# Patient Record
Sex: Female | Born: 1990 | Race: White | Hispanic: No | Marital: Single | Smoking: Current every day smoker
Health system: Southern US, Community
[De-identification: ages and names within clinical notes are randomized; demographics above are authoritative.]

## PROBLEM LIST (undated history)

## (undated) DIAGNOSIS — S069X9A Unspecified intracranial injury with loss of consciousness of unspecified duration, initial encounter: Secondary | ICD-10-CM

## (undated) DIAGNOSIS — F419 Anxiety disorder, unspecified: Secondary | ICD-10-CM

## (undated) DIAGNOSIS — A599 Trichomoniasis, unspecified: Secondary | ICD-10-CM

## (undated) HISTORY — DX: Unspecified intracranial injury with loss of consciousness of unspecified duration, initial encounter: S06.9X9A

## (undated) HISTORY — DX: Anxiety disorder, unspecified: F41.9

## (undated) HISTORY — DX: Trichomoniasis, unspecified: A59.9

---
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
## 2011-01-14 ENCOUNTER — Ambulatory Visit (INDEPENDENT_AMBULATORY_CARE_PROVIDER_SITE_OTHER): Payer: BC Managed Care – PPO | Admitting: Medical

## 2011-01-14 ENCOUNTER — Encounter: Payer: Self-pay | Admitting: Medical

## 2011-01-14 VITALS — BP 140/88 | HR 100 | Temp 98.5°F | Ht 63.0 in | Wt 152.0 lb

## 2011-01-14 DIAGNOSIS — M79609 Pain in unspecified limb: Secondary | ICD-10-CM

## 2011-01-14 DIAGNOSIS — M79673 Pain in unspecified foot: Secondary | ICD-10-CM

## 2011-01-14 NOTE — Progress Notes (Signed)
Subjective:   HPI  Taylor Green is a 20 y.o. female who presents for left heel pain. She woke up with the heel hurting today. She denies any injury or fall or trauma. Denies similar prior episode. She does walk barefooted at times but denies any recent concern for splinter. She has been wearing flip-flops in [place], hasn't been wearing cushioning or tennis shoes recently. She denies any redness or swelling, mainly just pain on the outside of her left heel.No other aggravating or relieving factors.  No other c/o.  The following portions of the patient's history were reviewed and updated as appropriate: allergies, current medications, past family history, past medical history, past social history, past surgical history and problem list.  Review of Systems Constitutional: denies fever, chills, sweats, unexpected weight change, anorexia, fatigue Dermatology: denies rash Neurology: no tingling, numbness    Objective:   Physical Exam  General appearance: alert, no distress, WD/WN,  Musculoskeletal: Left lateral heel with mild generalized tenderness, otherwise no obvious puncture wound, no erythema, no induration, no fluctuance, no swelling, mild pain with walking localized to the left lateral heel, otherwise ankle and foot exam unremarkable bilaterally, no obvious foreign body or puncture wound Extremities: no edema, no cyanosis, no clubbing Pulses: 2+ symmetric lower extremities, normal cap refill Neurological: Normal foot sensation and strength  Assessment :    Encounter Diagnosis  Name Primary?  . Heel pain Yes     Plan:    No obvious splinter or foreign body. I suspect a contusion of the heel. Advised ice, elevation, relative rest, over-the-counter ibuprofen 200 mg 2-3 tablets every 6 hours as needed, wear more supportive shoes such as tennis shoes instead of the flaps she has been wearing. If not improving or worse, call or return and we can consider x-ray.

## 2011-02-03 ENCOUNTER — Telehealth: Payer: Self-pay | Admitting: *Deleted

## 2011-02-03 NOTE — Telephone Encounter (Signed)
Done

## 2011-02-04 ENCOUNTER — Ambulatory Visit: Payer: BC Managed Care – PPO | Admitting: Medical

## 2011-03-24 ENCOUNTER — Encounter: Payer: BC Managed Care – PPO | Attending: Neurosurgery | Admitting: Neurosurgery

## 2011-03-24 DIAGNOSIS — R51 Headache: Secondary | ICD-10-CM | POA: Insufficient documentation

## 2011-03-24 DIAGNOSIS — X58XXXA Exposure to other specified factors, initial encounter: Secondary | ICD-10-CM | POA: Insufficient documentation

## 2011-03-24 DIAGNOSIS — Z79899 Other long term (current) drug therapy: Secondary | ICD-10-CM | POA: Insufficient documentation

## 2011-03-24 DIAGNOSIS — S069XAA Unspecified intracranial injury with loss of consciousness status unknown, initial encounter: Secondary | ICD-10-CM

## 2011-03-24 DIAGNOSIS — S069X9A Unspecified intracranial injury with loss of consciousness of unspecified duration, initial encounter: Secondary | ICD-10-CM
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
## 2011-03-27 ENCOUNTER — Other Ambulatory Visit: Payer: Self-pay | Admitting: Physical Medicine & Rehabilitation

## 2011-03-27 DIAGNOSIS — R51 Headache: Secondary | ICD-10-CM

## 2011-03-30 ENCOUNTER — Ambulatory Visit (HOSPITAL_COMMUNITY)
Admission: RE | Admit: 2011-03-30 | Discharge: 2011-03-30 | Disposition: A | Discharge status: Home / Self Care | Payer: BC Managed Care – PPO | Source: Ambulatory Visit | Attending: Physical Medicine & Rehabilitation | Admitting: Physical Medicine & Rehabilitation

## 2011-03-30 ENCOUNTER — Encounter (HOSPITAL_COMMUNITY): Payer: Self-pay

## 2011-03-30 DIAGNOSIS — G319 Degenerative disease of nervous system, unspecified: Secondary | ICD-10-CM | POA: Insufficient documentation

## 2011-03-30 DIAGNOSIS — Z8673 Personal history of transient ischemic attack (TIA), and cerebral infarction without residual deficits: Secondary | ICD-10-CM | POA: Insufficient documentation

## 2011-03-30 DIAGNOSIS — R51 Headache: Secondary | ICD-10-CM

## 2011-03-30 MED ORDER — IOHEXOL 300 MG/ML  SOLN
80.0000 mL | Freq: Once | INTRAMUSCULAR | Status: AC | PRN
  Administered 2011-03-30: 80 mL via INTRAVENOUS

## 2011-05-06 ENCOUNTER — Encounter: Payer: Self-pay | Admitting: Family Medicine

## 2011-05-20 ENCOUNTER — Ambulatory Visit: Payer: BC Managed Care – PPO | Admitting: Physical Medicine & Rehabilitation

## 2011-10-13 ENCOUNTER — Encounter: Payer: Self-pay | Admitting: Family Medicine

## 2011-10-13 ENCOUNTER — Ambulatory Visit (INDEPENDENT_AMBULATORY_CARE_PROVIDER_SITE_OTHER): Payer: BC Managed Care – PPO | Admitting: Family Medicine

## 2011-10-13 VITALS — BP 120/74 | HR 68 | Wt 159.0 lb

## 2011-10-13 DIAGNOSIS — R51 Headache: Secondary | ICD-10-CM

## 2011-10-13 DIAGNOSIS — S069X9A Unspecified intracranial injury with loss of consciousness of unspecified duration, initial encounter: Secondary | ICD-10-CM

## 2011-10-13 DIAGNOSIS — S069XAA Unspecified intracranial injury with loss of consciousness status unknown, initial encounter: Secondary | ICD-10-CM
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
## 2011-11-02 ENCOUNTER — Encounter: Payer: Self-pay | Admitting: Medical

## 2011-11-02 ENCOUNTER — Ambulatory Visit (INDEPENDENT_AMBULATORY_CARE_PROVIDER_SITE_OTHER): Payer: BC Managed Care – PPO | Admitting: Medical

## 2011-11-02 VITALS — BP 92/60 | HR 80 | Temp 97.9°F | Resp 16 | Wt 158.0 lb

## 2011-11-02 DIAGNOSIS — J029 Acute pharyngitis, unspecified: Secondary | ICD-10-CM

## 2011-11-02 DIAGNOSIS — J069 Acute upper respiratory infection, unspecified: Secondary | ICD-10-CM

## 2011-11-02 LAB — POCT RAPID STREP A (OFFICE): Rapid Strep A Screen: NEGATIVE

## 2011-11-02 NOTE — Patient Instructions (Signed)
Your symptoms and exam suggests a viral respiratory infection.  I recommend the following:  Salt water gargles  Warm fluids such as coffee and tea  Ibuprofen or Aleve for pain  Rest  Robitussin DM for cough/congestion  Call if worse or not improving
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
## 2012-03-02 ENCOUNTER — Encounter: Payer: Self-pay | Admitting: Medical

## 2012-03-02 ENCOUNTER — Ambulatory Visit (INDEPENDENT_AMBULATORY_CARE_PROVIDER_SITE_OTHER): Payer: BC Managed Care – PPO | Admitting: Medical

## 2012-03-02 VITALS — BP 100/80 | HR 60 | Temp 98.1°F | Resp 16 | Wt 161.0 lb

## 2012-03-02 DIAGNOSIS — R8290 Unspecified abnormal findings in urine: Secondary | ICD-10-CM

## 2012-03-02 DIAGNOSIS — R82998 Other abnormal findings in urine: Secondary | ICD-10-CM

## 2012-03-02 DIAGNOSIS — R300 Dysuria: Secondary | ICD-10-CM

## 2012-03-02 DIAGNOSIS — Z8619 Personal history of other infectious and parasitic diseases: Secondary | ICD-10-CM

## 2012-03-02 LAB — POCT URINALYSIS DIPSTICK
Ketones, UA: NEGATIVE
Leukocytes, UA: NEGATIVE
Protein, UA: NEGATIVE
Urobilinogen, UA: NEGATIVE
pH, UA: 6
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
## 2012-03-03 ENCOUNTER — Telehealth: Payer: Self-pay | Admitting: Medical

## 2012-03-03 LAB — GC/CHLAMYDIA PROBE AMP, URINE
Chlamydia, Swab/Urine, PCR: NEGATIVE
GC Probe Amp, Urine: NEGATIVE

## 2012-03-03 NOTE — Telephone Encounter (Signed)
Let her know that going out of town should not pose a problem with calling an antibiotic in. If we need to call something in, we will just need the phone number of the local drug store.

## 2012-03-03 NOTE — Telephone Encounter (Signed)
Left message word for word  

## 2012-03-04 ENCOUNTER — Other Ambulatory Visit: Payer: Self-pay | Admitting: Medical

## 2012-03-04 MED ORDER — CIPROFLOXACIN HCL 500 MG PO TABS: 500.0000 mg | ORAL_TABLET | Freq: Two times a day (BID) | ORAL | Status: AC

## 2012-03-05 LAB — URINE CULTURE

## 2012-03-23 ENCOUNTER — Encounter: Payer: Self-pay | Admitting: Medical

## 2012-03-23 ENCOUNTER — Ambulatory Visit (INDEPENDENT_AMBULATORY_CARE_PROVIDER_SITE_OTHER): Payer: BC Managed Care – PPO | Admitting: Medical

## 2012-03-23 VITALS — BP 100/80 | HR 60 | Temp 98.3°F | Resp 16 | Wt 166.0 lb

## 2012-03-23 DIAGNOSIS — J029 Acute pharyngitis, unspecified: Secondary | ICD-10-CM

## 2012-03-23 NOTE — Progress Notes (Signed)
Subjective:  Taylor Green is a 21 y.o. female who presents for evaluation of sore throat. Associated symptoms include runny nose, mild cough, but no sinus pressure, ear pain, fever, NVD.  Using nothing for the symptoms.  She has not had a recent close exposure to someone with proven streptococcal pharyngitis.  ROS as noted in HPI   Objective:     Filed Vitals:   03/23/12 1558  BP: 100/80  Pulse: 60  Temp: 98.3 F (36.8 C)  Resp: 16   General appearance: no distress, WD/WN, mildly ill-appearing HEENT: normocephalic, conjunctiva/corneas normal, sclerae anicteric, nares patent, no discharge or erythema, pharynx with mild erythema, no exudate.  Oral cavity: MMM, no lesions  Neck: supple, shoddy tender anterior lymphadenopathy, no thyromegaly Heart: RRR, normal S1, S2, no murmurs Lungs: CTA bilaterally, no wheezes, rhonchi, or rales  Laboratory Strep test done. Results:negative.    Assessment and Plan:   Encounter Diagnosis  Name Primary?  . Pharyngitis Yes    Advised that symptoms and exam suggest a viral etiology.  Discussed symptomatic treatment including salt water gargles, warm fluids, rest, hydrate well, can use over-the-counter Tylenol or Ibuprofen for throat pain, fever, or malaise. If worse or not improving within 2-3 days, call or return.

## 2012-06-21 ENCOUNTER — Ambulatory Visit (INDEPENDENT_AMBULATORY_CARE_PROVIDER_SITE_OTHER): Payer: BC Managed Care – PPO | Admitting: Medical

## 2012-06-21 ENCOUNTER — Telehealth: Payer: Self-pay | Admitting: Medical

## 2012-06-21 ENCOUNTER — Encounter: Payer: Self-pay | Admitting: Medical

## 2012-06-21 VITALS — BP 112/80 | HR 60 | Temp 98.3°F | Resp 16 | Wt 171.0 lb

## 2012-06-21 DIAGNOSIS — R4586 Emotional lability: Secondary | ICD-10-CM

## 2012-06-21 DIAGNOSIS — F411 Generalized anxiety disorder: Secondary | ICD-10-CM

## 2012-06-21 DIAGNOSIS — F3289 Other specified depressive episodes: Secondary | ICD-10-CM

## 2012-06-21 DIAGNOSIS — F329 Major depressive disorder, single episode, unspecified: Secondary | ICD-10-CM

## 2012-06-21 DIAGNOSIS — F4310 Post-traumatic stress disorder, unspecified: Secondary | ICD-10-CM

## 2012-06-21 DIAGNOSIS — F39 Unspecified mood [affective] disorder: Secondary | ICD-10-CM

## 2012-06-21 DIAGNOSIS — F419 Anxiety disorder, unspecified: Secondary | ICD-10-CM

## 2012-06-21 DIAGNOSIS — F32A Depression, unspecified: Secondary | ICD-10-CM

## 2012-06-21 DIAGNOSIS — J309 Allergic rhinitis, unspecified: Secondary | ICD-10-CM

## 2012-06-21 MED ORDER — MOMETASONE FUROATE 50 MCG/ACT NA SUSP: 2.0000 | Freq: Every day | NASAL | Status: DC

## 2012-06-21 NOTE — Telephone Encounter (Signed)
PT STATES PT ALSO NEEDS ALLERGY MEDS

## 2012-06-21 NOTE — Patient Instructions (Addendum)
Begin sample of Nasonex, 1 spray per nostril twice daily.  Continue Zyrtec 10mg  OTC at bedtime.   I will send the Nasonex as a prescription.  If this is too expensive then let me know.  If this is not working let me know.
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
## 2012-06-29 ENCOUNTER — Telehealth: Payer: Self-pay | Admitting: Family Medicine

## 2012-06-29 NOTE — Telephone Encounter (Signed)
Message copied by Janeice Robinson on Wed Jun 29, 2012  4:43 PM ------      Message from: Jac Canavan      Created: Tue Jun 28, 2012  8:01 AM       pls call and make sure she has made a f/u appt with psychiatry and counseling.  I saw her last week and strongly feel that she needs psychiatry f/u.            Find out when her appt is scheduled and with whom?

## 2012-06-29 NOTE — Telephone Encounter (Signed)
PATIENT HAS AN APPOINTMENT ON 07/12/12 AT [facility]. CLS

## 2012-07-16 ENCOUNTER — Emergency Department (INDEPENDENT_AMBULATORY_CARE_PROVIDER_SITE_OTHER)
Admission: EM | Admit: 2012-07-16 | Discharge: 2012-07-16 | Disposition: A | Discharge status: Home / Self Care | Payer: BC Managed Care – PPO | Source: Home / Self Care | Attending: Emergency Medicine | Admitting: Emergency Medicine

## 2012-07-16 ENCOUNTER — Encounter (HOSPITAL_COMMUNITY): Payer: Self-pay | Admitting: Emergency Medicine

## 2012-07-16 DIAGNOSIS — R1032 Left lower quadrant pain: Secondary | ICD-10-CM

## 2012-07-16 DIAGNOSIS — K5900 Constipation, unspecified: Secondary | ICD-10-CM

## 2012-07-16 LAB — POCT URINALYSIS DIP (DEVICE)
Bilirubin Urine: NEGATIVE
Glucose, UA: NEGATIVE mg/dL
Hgb urine dipstick: NEGATIVE
Leukocytes, UA: NEGATIVE
Nitrite: NEGATIVE

## 2012-07-16 MED ORDER — NAPROXEN 500 MG PO TABS: 500.0000 mg | ORAL_TABLET | Freq: Two times a day (BID) | ORAL | Status: DC

## 2012-07-16 MED ORDER — POLYETHYLENE GLYCOL 3350 17 GM/SCOOP PO POWD: 17.0000 g | Freq: Every day | ORAL | Status: DC
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
## 2012-07-16 NOTE — ED Notes (Signed)
Pt c/o abd pain on left side x2 days... Pain will radiate towards back... Describes pain as "Stabing" and constant... The pain is gradually getting worse... Denies: fevers, vomiting, nauseas, diarrhea... Having normal bowel movement... She is alert w/no signs of acute distress.

## 2012-07-16 NOTE — ED Provider Notes (Signed)
Medical screening examination/treatment/procedure(s) were performed by non-physician practitioner and as supervising physician I was immediately available for consultation/collaboration.  Leslee Home, M.D.   Reuben Likes, MD 07/16/12 2038

## 2012-10-22 ENCOUNTER — Encounter (HOSPITAL_COMMUNITY): Payer: Self-pay | Admitting: Emergency Medicine

## 2012-10-22 ENCOUNTER — Emergency Department (HOSPITAL_COMMUNITY)
Admission: EM | Admit: 2012-10-22 | Discharge: 2012-10-22 | Disposition: A | Discharge status: Home / Self Care | Payer: BC Managed Care – PPO | Attending: Emergency Medicine | Admitting: Emergency Medicine

## 2012-10-22 DIAGNOSIS — W010XXA Fall on same level from slipping, tripping and stumbling without subsequent striking against object, initial encounter: Secondary | ICD-10-CM | POA: Insufficient documentation

## 2012-10-22 DIAGNOSIS — Y9289 Other specified places as the place of occurrence of the external cause: Secondary | ICD-10-CM | POA: Insufficient documentation

## 2012-10-22 DIAGNOSIS — W1809XA Striking against other object with subsequent fall, initial encounter: Secondary | ICD-10-CM | POA: Insufficient documentation

## 2012-10-22 DIAGNOSIS — Y9329 Activity, other involving ice and snow: Secondary | ICD-10-CM | POA: Insufficient documentation

## 2012-10-22 DIAGNOSIS — S0990XA Unspecified injury of head, initial encounter: Secondary | ICD-10-CM

## 2012-10-22 DIAGNOSIS — Z87820 Personal history of traumatic brain injury: Secondary | ICD-10-CM | POA: Insufficient documentation

## 2012-10-22 DIAGNOSIS — F17200 Nicotine dependence, unspecified, uncomplicated: Secondary | ICD-10-CM | POA: Insufficient documentation

## 2012-10-22 DIAGNOSIS — F0781 Postconcussional syndrome: Secondary | ICD-10-CM

## 2012-10-22 DIAGNOSIS — W009XXA Unspecified fall due to ice and snow, initial encounter: Secondary | ICD-10-CM

## 2012-10-22 DIAGNOSIS — S0993XA Unspecified injury of face, initial encounter: Secondary | ICD-10-CM | POA: Insufficient documentation

## 2012-10-22 DIAGNOSIS — Z87828 Personal history of other (healed) physical injury and trauma: Secondary | ICD-10-CM | POA: Insufficient documentation

## 2012-10-22 MED ORDER — HYDROCODONE-ACETAMINOPHEN 5-325 MG PO TABS: 1.0000 | ORAL_TABLET | ORAL | Status: DC | PRN

## 2012-10-22 MED ORDER — HYDROCODONE-ACETAMINOPHEN 5-325 MG PO TABS
1.0000 | ORAL_TABLET | Freq: Once | ORAL | Status: AC
  Administered 2012-10-22: 1 via ORAL
  Filled 2012-10-22: qty 1
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
## 2012-10-22 NOTE — ED Notes (Signed)
Pt c/o HA, reports it started after she fell on the ice and hit the back of her head. Pt denies loc. Reports she was dizzy for a few seconds. Pt denies use of bld thinners. Pt ambulatory to room, in nad, resp e/u, skin warm and dry.
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
## 2012-10-22 NOTE — ED Provider Notes (Signed)
Medical screening examination/treatment/procedure(s) were performed by non-physician practitioner and as supervising physician I was immediately available for consultation/collaboration.   Melanie Belfi, MD 10/22/12 2336 

## 2012-12-01 ENCOUNTER — Telehealth: Payer: Self-pay | Admitting: Medical

## 2012-12-04 NOTE — Telephone Encounter (Signed)
I last saw in 06/2012.  Needs appt.  Also, if she is or was seeing counselor, please see if she can get the provider to fax over notes which helps in the evaluation and plan.

## 2012-12-07 ENCOUNTER — Ambulatory Visit (INDEPENDENT_AMBULATORY_CARE_PROVIDER_SITE_OTHER): Payer: BC Managed Care – PPO | Admitting: Medical

## 2012-12-07 ENCOUNTER — Encounter: Payer: Self-pay | Admitting: Medical

## 2012-12-07 VITALS — BP 100/80 | HR 73 | Temp 98.2°F | Resp 16 | Wt 180.0 lb

## 2012-12-07 DIAGNOSIS — F411 Generalized anxiety disorder: Secondary | ICD-10-CM

## 2012-12-07 MED ORDER — SERTRALINE HCL 50 MG PO TABS: ORAL_TABLET | ORAL | Status: DC

## 2012-12-07 MED ORDER — HYDROXYZINE HCL 25 MG PO TABS: ORAL_TABLET | ORAL | Status: DC
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
## 2012-12-07 NOTE — Patient Instructions (Addendum)
Begin Zoloft 1/2 tablet daily at bedtime for anxiety.     You can also use Hydroxyzine 1 tablet as needed up to 3 times daily to help with either panicky feeling or sleep.  If you feel depressed, suicidal, or worse in general, call, come in, or go to the emergency department  Andee Poles, MD Specialty: Psychiatry Board Certification: Psychiatry, Distinguished Fellow of the American Psychiatric Association  Practice Address:  [address] [place], [place] [address]  Phone: [phone] Fax: [phone]    Anxiety and Panic Attacks Your caregiver has informed you that you are having an anxiety or panic attack. There may be many forms of this. Most of the time these attacks come suddenly and without warning. They come at any time of day, including periods of sleep, and at any time of life. They may be strong and unexplained. Although panic attacks are very scary, they are physically harmless. Sometimes the cause of your anxiety is not known. Anxiety is a protective mechanism of the body in its fight or flight mechanism. Most of these perceived danger situations are actually nonphysical situations (such as anxiety over losing a job). CAUSES  The causes of an anxiety or panic attack are many. Panic attacks may occur in otherwise healthy people given a certain set of circumstances. There may be a genetic cause for panic attacks. Some medications may also have anxiety as a side effect. SYMPTOMS  Some of the most common feelings are:  Intense terror.  Dizziness, feeling faint.  Hot and cold flashes.  Fear of going crazy.  Feelings that nothing is real.  Sweating.  Shaking.  Chest pain or a fast heartbeat (palpitations).  Smothering, choking sensations.  Feelings of impending doom and that death is near.  Tingling of extremities, this may be from over-breathing.  Altered reality (derealization).  Being detached from yourself (depersonalization). Several  symptoms can be present to make up anxiety or panic attacks. DIAGNOSIS  The evaluation by your caregiver will depend on the type of symptoms you are experiencing. The diagnosis of anxiety or panic attack is made when no physical illness can be determined to be a cause of the symptoms. TREATMENT  Treatment to prevent anxiety and panic attacks may include:  Avoidance of circumstances that cause anxiety.  Reassurance and relaxation.  Regular exercise.  Relaxation therapies, such as yoga.  Psychotherapy with a psychiatrist or therapist.  Avoidance of caffeine, alcohol and illegal drugs.  Prescribed medication. SEEK IMMEDIATE MEDICAL CARE IF:   You experience panic attack symptoms that are different than your usual symptoms.  You have any worsening or concerning symptoms. Document Released: 08/03/2005 Document Revised: 10/26/2011 Document Reviewed: 12/05/2009 [facility] Patient Information 2013 [place], [place]. Marland Kitchen

## 2012-12-08 LAB — CBC WITH DIFFERENTIAL/PLATELET
Hemoglobin: 13.5 g/dL (ref 12.0–15.0)
Lymphocytes Relative: 28 % (ref 12–46)
Lymphs Abs: 1.8 10*3/uL (ref 0.7–4.0)
Monocytes Relative: 4 % (ref 3–12)
Neutrophils Relative %: 64 % (ref 43–77)
Platelets: 237 10*3/uL (ref 150–400)
RBC: 4.2 MIL/uL (ref 3.87–5.11)
WBC: 6.6 10*3/uL (ref 4.0–10.5)

## 2012-12-08 LAB — BASIC METABOLIC PANEL
CO2: 25 mEq/L (ref 19–32)
Chloride: 108 mEq/L (ref 96–112)
Glucose, Bld: 77 mg/dL (ref 70–99)
Potassium: 4.3 mEq/L (ref 3.5–5.3)
Sodium: 141 mEq/L (ref 135–145)

## 2012-12-12 ENCOUNTER — Telehealth: Payer: Self-pay | Admitting: Medical

## 2012-12-12 NOTE — Telephone Encounter (Signed)
Switching to am is fine.   1/2 tablet daily for 3-4 days, then go to 1 tablet daily.  Recheck in 2wk.

## 2012-12-12 NOTE — Telephone Encounter (Signed)
Patient's mother called concerning Zoloft Rx. She states the instructions state to take 1/2 at night. That was keeping her awake so she changed to taking in am.. Not having any problems with that but wanted to make sure that it was ok that  She changed to AM  Mother, Taylor Green called. Patient is at work per mother.  No HIPPA scanned in computer but HIPPA in chart patient signed in 2009 does list mother

## 2012-12-12 NOTE — Telephone Encounter (Signed)
Patient is aware of what Crosby Oyster PA-C message in detail and she understood. CLS

## 2012-12-21 ENCOUNTER — Ambulatory Visit: Payer: BC Managed Care – PPO | Admitting: Medical

## 2012-12-28 ENCOUNTER — Encounter: Payer: Self-pay | Admitting: Medical

## 2012-12-28 ENCOUNTER — Ambulatory Visit (INDEPENDENT_AMBULATORY_CARE_PROVIDER_SITE_OTHER): Payer: BC Managed Care – PPO | Admitting: Medical

## 2012-12-28 VITALS — BP 92/70 | HR 62 | Temp 98.3°F | Resp 16 | Wt 182.0 lb

## 2012-12-28 DIAGNOSIS — F411 Generalized anxiety disorder: Secondary | ICD-10-CM
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
## 2013-01-16 ENCOUNTER — Telehealth: Payer: Self-pay | Admitting: Medical

## 2013-01-16 ENCOUNTER — Other Ambulatory Visit: Payer: Self-pay | Admitting: Medical

## 2013-01-16 MED ORDER — SERTRALINE HCL 50 MG PO TABS: ORAL_TABLET | ORAL | Status: DC

## 2013-01-16 NOTE — Telephone Encounter (Signed)
Patient Is aware. CLS

## 2013-01-16 NOTE — Telephone Encounter (Signed)
Glad to hear its working better.  C/t same dose (50mg ), recheck in 1-77mo

## 2013-04-10 ENCOUNTER — Other Ambulatory Visit: Payer: Self-pay | Admitting: Medical

## 2013-04-11 NOTE — Telephone Encounter (Signed)
I sent 30 day supply.  Have her come in for f/u appt

## 2013-04-11 NOTE — Telephone Encounter (Signed)
Is this okay to fill? 

## 2013-04-12 NOTE — Telephone Encounter (Signed)
Patient is aware and her appointment is on 05/10/13. CLS

## 2013-04-26 ENCOUNTER — Ambulatory Visit (INDEPENDENT_AMBULATORY_CARE_PROVIDER_SITE_OTHER): Payer: BC Managed Care – PPO | Admitting: Family Medicine

## 2013-04-26 VITALS — BP 116/72 | HR 60 | Temp 97.5°F | Wt 176.0 lb

## 2013-04-26 DIAGNOSIS — J029 Acute pharyngitis, unspecified: Secondary | ICD-10-CM

## 2013-04-26 LAB — POCT RAPID STREP A (OFFICE): Rapid Strep A Screen: NEGATIVE

## 2013-04-26 NOTE — Progress Notes (Signed)
  Subjective:    Patient ID: Taylor Green, female    DOB: 12-10-1990, 22 y.o.   MRN: [id number]  HPI She has a three-day history of nasal congestion, chest congestion with some discomfort, fatigue, sore throat, chills but no fever. She did try one Tylenol.   Review of Systems     Objective:   Physical Exam alert and in no distress. Tympanic membranes and canals are normal. Throat is clear. Tonsils are normal. Neck is supple without adenopathy or thyromegaly. Cardiac exam shows a regular sinus rhythm without murmurs or gallops. Lungs are clear to auscultation.  Strep Screen is negative       Assessment & Plan:  Acute pharyngitis - Plan: Rapid Strep A  supportive care. Call if further trouble.

## 2013-05-10 ENCOUNTER — Ambulatory Visit: Payer: Self-pay | Admitting: Medical

## 2013-05-11 ENCOUNTER — Telehealth: Payer: Self-pay | Admitting: Family Medicine

## 2013-05-11 ENCOUNTER — Encounter (HOSPITAL_COMMUNITY): Payer: Self-pay | Admitting: Emergency Medicine

## 2013-05-11 ENCOUNTER — Emergency Department (HOSPITAL_COMMUNITY)
Admission: EM | Admit: 2013-05-11 | Discharge: 2013-05-11 | Disposition: A | Discharge status: Home / Self Care | Payer: BC Managed Care – PPO | Attending: Emergency Medicine | Admitting: Emergency Medicine

## 2013-05-11 ENCOUNTER — Emergency Department (HOSPITAL_COMMUNITY): Payer: BC Managed Care – PPO

## 2013-05-11 DIAGNOSIS — Z87820 Personal history of traumatic brain injury: Secondary | ICD-10-CM | POA: Insufficient documentation

## 2013-05-11 DIAGNOSIS — F17200 Nicotine dependence, unspecified, uncomplicated: Secondary | ICD-10-CM | POA: Insufficient documentation

## 2013-05-11 DIAGNOSIS — R42 Dizziness and giddiness: Secondary | ICD-10-CM | POA: Insufficient documentation

## 2013-05-11 DIAGNOSIS — Z79899 Other long term (current) drug therapy: Secondary | ICD-10-CM | POA: Insufficient documentation

## 2013-05-11 DIAGNOSIS — Y92410 Unspecified street and highway as the place of occurrence of the external cause: Secondary | ICD-10-CM | POA: Insufficient documentation

## 2013-05-11 DIAGNOSIS — S0990XA Unspecified injury of head, initial encounter: Secondary | ICD-10-CM

## 2013-05-11 DIAGNOSIS — Y9389 Activity, other specified: Secondary | ICD-10-CM | POA: Insufficient documentation
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
## 2013-05-11 NOTE — ED Notes (Signed)
MD James at bedside.  

## 2013-05-15 NOTE — Telephone Encounter (Signed)
LM

## 2013-05-23 ENCOUNTER — Ambulatory Visit: Payer: BC Managed Care – PPO | Admitting: Medical

## 2013-05-24 ENCOUNTER — Encounter: Payer: Self-pay | Admitting: Medical

## 2013-05-24 ENCOUNTER — Ambulatory Visit (INDEPENDENT_AMBULATORY_CARE_PROVIDER_SITE_OTHER): Payer: BC Managed Care – PPO | Admitting: Medical

## 2013-05-24 VITALS — BP 92/60 | HR 58 | Temp 98.0°F | Resp 16 | Wt 171.0 lb

## 2013-05-24 DIAGNOSIS — Z23 Encounter for immunization: Secondary | ICD-10-CM

## 2013-05-24 DIAGNOSIS — F411 Generalized anxiety disorder: Secondary | ICD-10-CM

## 2013-05-24 MED ORDER — SERTRALINE HCL 100 MG PO TABS: 100.0000 mg | ORAL_TABLET | Freq: Every day | ORAL | Status: DC

## 2013-05-24 NOTE — Patient Instructions (Signed)
[employer]  [phone]  Call college and get appointment to speak with admission and financial aid.   Don't wait.  Do this now.  Work on getting into to a college program to pursue a career.  Stay away for problem guys.  increase Zoloft to 100mg  daily.  Call back in 2 weeks to let me know if this is helping.  Consider counseling to deal with anxiety and mood.     [facility] [address] [place], [place] [address] [phone]  Miguel Aschoff Ph.D., clinical psychologist [phone] office [address] [place], [place] [address] Cognitive Behavior Therapy, Depression, Bipolar, Anxiety, Grief and Loss  [facility] [phone] office [address], [place], [place] [address] Crisis services, Family support, in home therapy, treatment for Anxiety, PTSD, Sexual Assault, Substance Abuse, Financial/Credit Counseling, Variety of other services
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
## 2013-06-14 ENCOUNTER — Other Ambulatory Visit: Payer: Self-pay | Admitting: Orthopedic Surgery

## 2013-06-14 DIAGNOSIS — R102 Pelvic and perineal pain: Secondary | ICD-10-CM

## 2013-06-19 ENCOUNTER — Ambulatory Visit
Admission: RE | Admit: 2013-06-19 | Discharge: 2013-06-19 | Disposition: A | Discharge status: Home / Self Care | Payer: BC Managed Care – PPO | Source: Ambulatory Visit | Attending: Orthopedic Surgery | Admitting: Orthopedic Surgery

## 2013-06-19 DIAGNOSIS — R102 Pelvic and perineal pain: Secondary | ICD-10-CM

## 2013-07-10 ENCOUNTER — Other Ambulatory Visit: Payer: Self-pay | Admitting: Orthopedic Surgery

## 2013-07-10 ENCOUNTER — Other Ambulatory Visit (HOSPITAL_COMMUNITY)
Admission: RE | Admit: 2013-07-10 | Discharge: 2013-07-10 | Disposition: A | Discharge status: Home / Self Care | Payer: BC Managed Care – PPO | Source: Ambulatory Visit | Attending: Emergency Medicine | Admitting: Emergency Medicine

## 2013-07-10 ENCOUNTER — Encounter (HOSPITAL_COMMUNITY): Payer: Self-pay | Admitting: Emergency Medicine

## 2013-07-10 ENCOUNTER — Emergency Department (INDEPENDENT_AMBULATORY_CARE_PROVIDER_SITE_OTHER)
Admission: EM | Admit: 2013-07-10 | Discharge: 2013-07-10 | Disposition: A | Discharge status: Home / Self Care | Payer: BC Managed Care – PPO | Source: Home / Self Care | Attending: Family Medicine | Admitting: Family Medicine

## 2013-07-10 DIAGNOSIS — A64 Unspecified sexually transmitted disease: Secondary | ICD-10-CM

## 2013-07-10 DIAGNOSIS — M47818 Spondylosis without myelopathy or radiculopathy, sacral and sacrococcygeal region: Secondary | ICD-10-CM

## 2013-07-10 DIAGNOSIS — N760 Acute vaginitis: Secondary | ICD-10-CM | POA: Insufficient documentation

## 2013-07-10 DIAGNOSIS — Z113 Encounter for screening for infections with a predominantly sexual mode of transmission: Secondary | ICD-10-CM | POA: Insufficient documentation

## 2013-07-10 LAB — POCT URINALYSIS DIP (DEVICE)
Bilirubin Urine: NEGATIVE
Ketones, ur: NEGATIVE mg/dL
Nitrite: NEGATIVE
Protein, ur: NEGATIVE mg/dL

## 2013-07-10 LAB — POCT PREGNANCY, URINE: Preg Test, Ur: NEGATIVE

## 2013-07-10 MED ORDER — AZITHROMYCIN 250 MG PO TABS
1000.0000 mg | ORAL_TABLET | Freq: Once | ORAL | Status: AC
  Administered 2013-07-10: 1000 mg via ORAL

## 2013-07-10 MED ORDER — LIDOCAINE HCL (PF) 1 % IJ SOLN
INTRAMUSCULAR | Status: AC
  Filled 2013-07-10: qty 5

## 2013-07-10 MED ORDER — CEFTRIAXONE SODIUM 250 MG IJ SOLR
250.0000 mg | Freq: Once | INTRAMUSCULAR | Status: AC
  Administered 2013-07-10: 250 mg via INTRAMUSCULAR

## 2013-07-10 MED ORDER — CEFTRIAXONE SODIUM 250 MG IJ SOLR
INTRAMUSCULAR | Status: AC
  Filled 2013-07-10: qty 250

## 2013-07-10 MED ORDER — AZITHROMYCIN 250 MG PO TABS
ORAL_TABLET | ORAL | Status: AC
  Filled 2013-07-10: qty 4

## 2013-07-10 NOTE — ED Provider Notes (Signed)
Medical screening examination/treatment/procedure(s) were performed by a resident physician or non-physician practitioner and as the supervising physician I was immediately available for consultation/collaboration.  Uzziel Russey, MD     Cordarrel Stiefel S Smith Mcnicholas, MD 07/10/13 2109 
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
## 2013-07-10 NOTE — ED Notes (Signed)
Partner told her today that he has Chlamydia.  C/o vaginal discharge and burning when she urinates onset a few days ago.

## 2013-07-14 ENCOUNTER — Ambulatory Visit
Admission: RE | Admit: 2013-07-14 | Discharge: 2013-07-14 | Disposition: A | Discharge status: Home / Self Care | Payer: BC Managed Care – PPO | Source: Ambulatory Visit | Attending: Orthopedic Surgery | Admitting: Orthopedic Surgery

## 2013-07-14 DIAGNOSIS — M47818 Spondylosis without myelopathy or radiculopathy, sacral and sacrococcygeal region: Secondary | ICD-10-CM

## 2013-07-14 MED ORDER — IOHEXOL 180 MG/ML  SOLN
1.0000 mL | Freq: Once | INTRAMUSCULAR | Status: AC | PRN
  Administered 2013-07-14: 1 mL via INTRA_ARTERIAL

## 2013-07-14 MED ORDER — METHYLPREDNISOLONE ACETATE 40 MG/ML INJ SUSP (RADIOLOG
120.0000 mg | Freq: Once | INTRAMUSCULAR | Status: AC
  Administered 2013-07-14: 120 mg via INTRA_ARTICULAR

## 2013-07-15 ENCOUNTER — Telehealth (HOSPITAL_COMMUNITY): Payer: Self-pay | Admitting: Family Medicine

## 2013-07-15 MED ORDER — METRONIDAZOLE 500 MG PO TABS: 500.0000 mg | ORAL_TABLET | Freq: Two times a day (BID) | ORAL | Status: DC

## 2013-07-15 NOTE — ED Notes (Signed)
Called patient about results.  Recommended HIV and RPR testing in the near future.  Advised patient to contact her sexual partner(s) Additionally we'll treat BV with Flagyl.   Rodolph Bong, MD 07/15/13 1003

## 2013-07-18 ENCOUNTER — Telehealth: Payer: Self-pay | Admitting: Family Medicine

## 2013-07-18 NOTE — Telephone Encounter (Signed)
ER letter sent 

## 2013-07-20 NOTE — ED Notes (Signed)
GC/Chlamydia pos., Affirm: Candida and Trich neg., Gardnerella pos.  Pt. adequately treated with Rocephin and Zithromax.  11/25 Message to Dr. Denyse Amass.  11/29 He e-prescribed Flagyl and notified pt. of her results.  DHHS forms x 2 faxed to the [facility]. Vassie Moselle 07/20/2013

## 2013-08-07 ENCOUNTER — Encounter: Payer: Self-pay | Admitting: Family Medicine

## 2013-08-07 ENCOUNTER — Ambulatory Visit (INDEPENDENT_AMBULATORY_CARE_PROVIDER_SITE_OTHER): Payer: BC Managed Care – PPO | Admitting: Family Medicine

## 2013-08-07 VITALS — BP 110/70 | HR 64 | Temp 98.2°F | Ht 63.0 in | Wt 171.0 lb

## 2013-08-07 DIAGNOSIS — F411 Generalized anxiety disorder: Secondary | ICD-10-CM | POA: Insufficient documentation

## 2013-08-07 DIAGNOSIS — F17200 Nicotine dependence, unspecified, uncomplicated: Secondary | ICD-10-CM

## 2013-08-07 DIAGNOSIS — M940 Chondrocostal junction syndrome [Tietze]: Secondary | ICD-10-CM

## 2013-08-07 DIAGNOSIS — J069 Acute upper respiratory infection, unspecified: Secondary | ICD-10-CM

## 2013-08-07 NOTE — Patient Instructions (Signed)
  Costochondritis, left-sided.  Use moist heat and Naproxen prescription that you have at home.  Take 500mg  twice daily with food.  Do not take other anti-inflammatories along with it (ie no advil, motrin or aleve).  Okay to use acetaminophen (Tylenol) as needed.   URI--use decongestants/cold medications as needed.  If you develop thick mucus or phlegm, add guaifenesin (mucinex or robitussin). Drink plenty of fluids Stop smoking (cigarettes and marijuana).  Return if your symptoms persist or worsen for further evaluation.  Please try and quit smoking--start thinking about why/when you smoke (habit, boredom, stress) in order to come up with effective strategies to cut back or quit. Available resources to help you quit include free counseling through [facility] ([place]Quitline.[facility] or 1-800-QUITNOW), smoking cessation classes through [facility] (call to find out schedule), over-the-counter nicotine replacements, and e-cigarettes (although this may not help break the hand-mouth habit).  Many insurance companies also have smoking cessation programs (which may decrease the cost of patches, meds if enrolled).  If these methods are not effective for you, and you are motivated to quit, return to discuss the possibility of prescription medications.
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
## 2013-09-19 ENCOUNTER — Other Ambulatory Visit: Payer: Self-pay | Admitting: Medical

## 2013-10-27 ENCOUNTER — Ambulatory Visit (INDEPENDENT_AMBULATORY_CARE_PROVIDER_SITE_OTHER): Payer: BC Managed Care – PPO | Admitting: Psychology

## 2013-10-27 DIAGNOSIS — F411 Generalized anxiety disorder: Secondary | ICD-10-CM

## 2013-11-01 ENCOUNTER — Telehealth: Payer: Self-pay | Admitting: Medical

## 2013-11-01 NOTE — Telephone Encounter (Signed)
Mom states pt sees psychologist Caralyn GuileDavid Gutterman (for brain injury) & he recommends Neurological testing. Neuropsychologist Dr. Karmen BongoMicheal Zelson with [facility] needs referral for this, & must come from primary care doctor  Please Fax referral to #[phone]

## 2013-11-02 ENCOUNTER — Ambulatory Visit (INDEPENDENT_AMBULATORY_CARE_PROVIDER_SITE_OTHER): Payer: BC Managed Care – PPO | Admitting: Psychology

## 2013-11-02 DIAGNOSIS — F411 Generalized anxiety disorder: Secondary | ICD-10-CM

## 2013-11-02 NOTE — Telephone Encounter (Signed)
Please refer as requested 

## 2013-11-08 NOTE — Telephone Encounter (Signed)
I fax over the patients information to Dr. Leonides CaveZelson to [phone] and the Rep. Form the office said that he will contact the patient and set her up an appointment. CLS

## 2013-11-17 ENCOUNTER — Ambulatory Visit: Payer: BC Managed Care – PPO | Admitting: Psychology

## 2014-02-08 ENCOUNTER — Other Ambulatory Visit: Payer: Self-pay | Admitting: Medical

## 2014-02-08 DIAGNOSIS — S069XAS Unspecified intracranial injury with loss of consciousness status unknown, sequela: Secondary | ICD-10-CM | POA: Diagnosis not present

## 2014-02-08 DIAGNOSIS — F411 Generalized anxiety disorder: Secondary | ICD-10-CM | POA: Diagnosis not present

## 2014-02-08 DIAGNOSIS — F09 Unspecified mental disorder due to known physiological condition: Secondary | ICD-10-CM | POA: Diagnosis not present

## 2014-02-08 DIAGNOSIS — S069X9S Unspecified intracranial injury with loss of consciousness of unspecified duration, sequela: Secondary | ICD-10-CM

## 2014-02-08 DIAGNOSIS — X58XXXA Exposure to other specified factors, initial encounter: Secondary | ICD-10-CM | POA: Diagnosis not present

## 2014-02-12 ENCOUNTER — Other Ambulatory Visit: Payer: Self-pay | Admitting: Medical

## 2014-02-12 ENCOUNTER — Telehealth: Payer: Self-pay | Admitting: Medical

## 2014-02-12 MED ORDER — SERTRALINE HCL 100 MG PO TABS: 100.0000 mg | ORAL_TABLET | Freq: Every day | ORAL | Status: DC

## 2014-02-12 NOTE — Telephone Encounter (Signed)
Pt's mother called and stated that pt is completely out of Zoloft. Pt is seeing Dr. Gutterman andDellia Cloud Dr. Leonides CaveZelson. They are doing some additional testing but for the time being they want her to stay on current meds. Pt's mother states that refill was denied and she has concerns because pt is completely out. Please refill zoloft to [facility] and please let mother know that it has been done.

## 2014-02-12 NOTE — Telephone Encounter (Signed)
I had said months ago with call in for refills she needs f/u visit.  I last saw her in October.  Given her anxiety issues, I need to see her more frequently than 8 months later.  I sent 30 day supply.  Make f/u appt

## 2014-02-12 NOTE — Telephone Encounter (Signed)
Taylor Green , is it okay to refill her Zoloft?

## 2014-02-13 NOTE — Telephone Encounter (Signed)
Patient is aware of the message and she has an appointment on 03/05/14. CLS

## 2014-02-14 DIAGNOSIS — X58XXXA Exposure to other specified factors, initial encounter: Secondary | ICD-10-CM | POA: Diagnosis not present

## 2014-02-14 DIAGNOSIS — S069X9S Unspecified intracranial injury with loss of consciousness of unspecified duration, sequela: Secondary | ICD-10-CM

## 2014-02-14 DIAGNOSIS — S069XAS Unspecified intracranial injury with loss of consciousness status unknown, sequela: Secondary | ICD-10-CM | POA: Diagnosis not present

## 2014-02-14 DIAGNOSIS — F411 Generalized anxiety disorder: Secondary | ICD-10-CM | POA: Diagnosis not present

## 2014-02-14 DIAGNOSIS — F09 Unspecified mental disorder due to known physiological condition: Secondary | ICD-10-CM | POA: Diagnosis not present

## 2014-03-05 ENCOUNTER — Ambulatory Visit: Payer: Self-pay | Admitting: Medical

## 2014-03-14 ENCOUNTER — Encounter: Payer: Self-pay | Admitting: Medical

## 2014-03-14 ENCOUNTER — Telehealth: Payer: Self-pay | Admitting: Medical

## 2014-03-14 ENCOUNTER — Other Ambulatory Visit: Payer: Self-pay | Admitting: Family Medicine

## 2014-03-14 ENCOUNTER — Ambulatory Visit (INDEPENDENT_AMBULATORY_CARE_PROVIDER_SITE_OTHER): Payer: BC Managed Care – PPO | Admitting: Medical

## 2014-03-14 VITALS — BP 110/80 | HR 67 | Temp 97.4°F | Resp 16 | Wt 187.0 lb

## 2014-03-14 DIAGNOSIS — F341 Dysthymic disorder: Secondary | ICD-10-CM

## 2014-03-14 DIAGNOSIS — F4010 Social phobia, unspecified: Secondary | ICD-10-CM

## 2014-03-14 DIAGNOSIS — F09 Unspecified mental disorder due to known physiological condition: Secondary | ICD-10-CM

## 2014-03-14 DIAGNOSIS — Z01419 Encounter for gynecological examination (general) (routine) without abnormal findings: Secondary | ICD-10-CM

## 2014-03-14 MED ORDER — CITALOPRAM HYDROBROMIDE 40 MG PO TABS: ORAL_TABLET | ORAL | Status: DC

## 2014-03-14 NOTE — Telephone Encounter (Signed)
REFERRAL TO GYN IS IN EPIC AND THEY WILL CONTACT HER FOR HER APPOINTMENT. CLS

## 2014-03-14 NOTE — Telephone Encounter (Signed)
Refer to gynecology for routine care and anemia

## 2014-03-14 NOTE — Patient Instructions (Signed)
  Thank you for giving me the opportunity to serve you today.    Your diagnosis today includes:  Specific recommendations today include:  Start cutting Zoloft in half and take 1/2 tablet daily for 1 week.  After 1 week, use 1/2 tablet zoloft every other day for another week then stop.   Begin Citalopram 40mg , 1/2 tablet at bedtime for 1 week.   After 1 week, increase to 1 tablet of Citalopram at bedtime.   We will refer you for Cognitive Behavioral Therapy  Recheck in 1 month
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
## 2014-04-11 ENCOUNTER — Ambulatory Visit (INDEPENDENT_AMBULATORY_CARE_PROVIDER_SITE_OTHER): Payer: BC Managed Care – PPO | Admitting: Medical

## 2014-04-11 ENCOUNTER — Encounter: Payer: Self-pay | Admitting: Medical

## 2014-04-11 VITALS — BP 100/80 | HR 80 | Temp 97.6°F | Resp 16 | Wt 190.0 lb

## 2014-04-11 DIAGNOSIS — R232 Flushing: Secondary | ICD-10-CM

## 2014-04-11 DIAGNOSIS — F341 Dysthymic disorder: Secondary | ICD-10-CM

## 2014-04-11 DIAGNOSIS — Z23 Encounter for immunization: Secondary | ICD-10-CM

## 2014-04-11 DIAGNOSIS — F4010 Social phobia, unspecified: Secondary | ICD-10-CM

## 2014-04-11 DIAGNOSIS — F09 Unspecified mental disorder due to known physiological condition: Secondary | ICD-10-CM

## 2014-04-11 DIAGNOSIS — N951 Menopausal and female climacteric states: Secondary | ICD-10-CM

## 2014-04-11 MED ORDER — BUSPIRONE HCL 7.5 MG PO TABS: 7.5000 mg | ORAL_TABLET | Freq: Two times a day (BID) | ORAL | Status: DC

## 2014-04-11 NOTE — Progress Notes (Signed)
   Subjective:   Taylor Green is a 23 y.o. female presenting on 04/11/2014 with DISCUSS CHANGING HER MEDICATION  Here for recheck on social anxiety, dysthymia.  Taking citalopram, seeing improvement in anxiety, but has some mild hot flashes.  We changed from Zoloft last visit.   She did see Dr. Dellia Green twice, was advised to add on Buspar.  She is still working at [employer], still living at home.  No other aggravating or relieving factors.  No other complaint.  Review of Systems ROS as in subjective      Objective:    BP 100/80  Pulse 80  Temp(Src) 97.6 F (36.4 C) (Oral)  Resp 16  Wt 190 lb (86.183 kg)  General appearance: alert, no distress, WD/WN Psych: Pleasant, good eye contact, dressed appropriate      Assessment: Encounter Diagnoses  Name Primary?  . Social anxiety disorder Yes  . Dysthymic disorder   . Unspecified persistent mental disorders due to conditions classified elsewhere   . Need for prophylactic vaccination and inoculation against influenza   . Hot flashes      Plan: Psychological evaluation from 01/2014 shows diagnoses are cognitive disorder, social anxiety disorder, dysthymic disorder, there has been significant cognitive deficits since her traumatic brain injury several years ago, she was advised by the psychologist to stop using marijuana, to not pursue higher education such as college level classes, and have parents help her with her financial responsibilities and other responsibilities  Patient isn't all that good of historian.  I called mother Taylor Green and spoke to her immediately after the visit, and asked that she accompany her from now on with her visits.  Mom will schedule her a f/u appt with her psychologist, Dr. Dellia Green.     We will c/t to monitor and side effects.  She reports some hot flashes, but states they are minimal and feels improved on citalopram.   C/t Citalopram , 1/2 tablet daily for now, add Buspar 7.5mg  BID.  discussed  risks/benefits of medication, symptoms that would prompt immediate recheck.  C/t cognitive behavioral therapy with Dr. Caralyn Green  Counseled on the influenza virus vaccine.  Vaccine information sheet given.  Influenza vaccine given after consent obtained.  F/u 36mo   Taylor Green was seen today for discuss changing her medication.  Diagnoses and associated orders for this visit:  Social anxiety disorder  Dysthymic disorder  Unspecified persistent mental disorders due to conditions classified elsewhere  Need for prophylactic vaccination and inoculation against influenza - Flu Vaccine QUAD 36+ mos IM  Hot flashes  Other Orders - busPIRone (BUSPAR) 7.5 MG tablet; Take 1 tablet (7.5 mg total) by mouth 2 (two) times daily.    Return in about 1 month (around 05/12/2014).

## 2014-04-17 ENCOUNTER — Ambulatory Visit: Payer: BC Managed Care – PPO | Admitting: Psychology

## 2014-08-30 ENCOUNTER — Ambulatory Visit: Payer: Medicaid Other | Admitting: Medical

## 2014-09-02 ENCOUNTER — Encounter (HOSPITAL_COMMUNITY): Payer: Self-pay | Admitting: Emergency Medicine

## 2014-09-02 ENCOUNTER — Emergency Department (HOSPITAL_COMMUNITY)
Admission: EM | Admit: 2014-09-02 | Discharge: 2014-09-02 | Disposition: A | Discharge status: Home / Self Care | Payer: Medicaid Other | Source: Home / Self Care | Attending: Family Medicine | Admitting: Family Medicine

## 2014-09-02 DIAGNOSIS — H6502 Acute serous otitis media, left ear: Secondary | ICD-10-CM

## 2014-09-02 MED ORDER — AMOXICILLIN-POT CLAVULANATE 875-125 MG PO TABS: 1.0000 | ORAL_TABLET | Freq: Two times a day (BID) | ORAL | Status: DC

## 2014-09-02 NOTE — ED Notes (Signed)
C/o  Left ear pain x 2 wks.  Denies drainage and fever.  States used otc eardrops which seemed to make it worse.   Denies any other symptoms.

## 2014-09-02 NOTE — Discharge Instructions (Signed)
Thank you for coming in today. Call or go to the emergency room if you get worse, have trouble breathing, have chest pains, or palpitations.    Otitis Media Otitis media is redness, soreness, and inflammation of the middle ear. Otitis media may be caused by allergies or, most commonly, by infection. Often it occurs as a complication of the common cold. SIGNS AND SYMPTOMS Symptoms of otitis media may include:  Earache.  Fever.  Ringing in your ear.  Headache.  Leakage of fluid from the ear. DIAGNOSIS To diagnose otitis media, your health care provider will examine your ear with an otoscope. This is an instrument that allows your health care provider to see into your ear in order to examine your eardrum. Your health care provider also will ask you questions about your symptoms. TREATMENT  Typically, otitis media resolves on its own within 3-5 days. Your health care provider may prescribe medicine to ease your symptoms of pain. If otitis media does not resolve within 5 days or is recurrent, your health care provider may prescribe antibiotic medicines if he or she suspects that a bacterial infection is the cause. HOME CARE INSTRUCTIONS   If you were prescribed an antibiotic medicine, finish it all even if you start to feel better.  Take medicines only as directed by your health care provider.  Keep all follow-up visits as directed by your health care provider. SEEK MEDICAL CARE IF:  You have otitis media only in one ear, or bleeding from your nose, or both.  You notice a lump on your neck.  You are not getting better in 3-5 days.  You feel worse instead of better. SEEK IMMEDIATE MEDICAL CARE IF:   You have pain that is not controlled with medicine.  You have swelling, redness, or pain around your ear or stiffness in your neck.  You notice that part of your face is paralyzed.  You notice that the bone behind your ear (mastoid) is tender when you touch it. MAKE SURE YOU:    Understand these instructions.  Will watch your condition.  Will get help right away if you are not doing well or get worse. Document Released: 05/08/2004 Document Revised: 12/18/2013 Document Reviewed: 02/28/2013 [facility] Patient Information 2015 [place], [place]. This information is not intended to replace advice given to you by your health care provider. Make sure you discuss any questions you have with your health care provider.
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
## 2015-02-19 ENCOUNTER — Encounter: Payer: Self-pay | Admitting: Emergency Medicine

## 2015-02-19 ENCOUNTER — Emergency Department
Admission: EM | Admit: 2015-02-19 | Discharge: 2015-02-19 | Disposition: A | Discharge status: Home / Self Care | Payer: BLUE CROSS/BLUE SHIELD | Attending: Emergency Medicine | Admitting: Emergency Medicine

## 2015-02-19 ENCOUNTER — Emergency Department: Payer: BLUE CROSS/BLUE SHIELD

## 2015-02-19 DIAGNOSIS — S80211A Abrasion, right knee, initial encounter: Secondary | ICD-10-CM | POA: Insufficient documentation

## 2015-02-19 DIAGNOSIS — Z79899 Other long term (current) drug therapy: Secondary | ICD-10-CM | POA: Diagnosis not present

## 2015-02-19 DIAGNOSIS — Z720 Tobacco use: Secondary | ICD-10-CM | POA: Insufficient documentation

## 2015-02-19 DIAGNOSIS — Z792 Long term (current) use of antibiotics: Secondary | ICD-10-CM | POA: Diagnosis not present

## 2015-02-19 DIAGNOSIS — S8002XA Contusion of left knee, initial encounter: Secondary | ICD-10-CM | POA: Diagnosis not present

## 2015-02-19 DIAGNOSIS — Y998 Other external cause status: Secondary | ICD-10-CM | POA: Insufficient documentation

## 2015-02-19 DIAGNOSIS — S90812A Abrasion, left foot, initial encounter: Secondary | ICD-10-CM | POA: Diagnosis not present

## 2015-02-19 DIAGNOSIS — W1839XA Other fall on same level, initial encounter: Secondary | ICD-10-CM | POA: Insufficient documentation

## 2015-02-19 DIAGNOSIS — Y9389 Activity, other specified: Secondary | ICD-10-CM | POA: Insufficient documentation

## 2015-02-19 DIAGNOSIS — S90512A Abrasion, left ankle, initial encounter: Secondary | ICD-10-CM | POA: Diagnosis not present

## 2015-02-19 DIAGNOSIS — Y9289 Other specified places as the place of occurrence of the external cause: Secondary | ICD-10-CM | POA: Diagnosis not present

## 2015-02-19 DIAGNOSIS — T148XXA Other injury of unspecified body region, initial encounter: Secondary | ICD-10-CM

## 2015-02-19 DIAGNOSIS — S8782XA Crushing injury of left lower leg, initial encounter: Secondary | ICD-10-CM | POA: Diagnosis not present

## 2015-02-19 DIAGNOSIS — S8992XA Unspecified injury of left lower leg, initial encounter: Secondary | ICD-10-CM | POA: Diagnosis present

## 2015-02-19 DIAGNOSIS — Z793 Long term (current) use of hormonal contraceptives: Secondary | ICD-10-CM | POA: Insufficient documentation

## 2015-02-19 MED ORDER — NAPROXEN 500 MG PO TABS: 500.0000 mg | ORAL_TABLET | Freq: Two times a day (BID) | ORAL | Status: DC

## 2015-02-19 MED ORDER — BACITRACIN ZINC 500 UNIT/GM EX OINT
TOPICAL_OINTMENT | CUTANEOUS | Status: AC
  Filled 2015-02-19: qty 0.9

## 2015-02-19 MED ORDER — BACITRACIN 500 UNIT/GM EX OINT
2.0000 "application " | TOPICAL_OINTMENT | Freq: Two times a day (BID) | CUTANEOUS | Status: DC
  Administered 2015-02-19: 2 via TOPICAL

## 2015-02-19 MED ORDER — OXYCODONE-ACETAMINOPHEN 5-325 MG PO TABS: 1.0000 | ORAL_TABLET | Freq: Four times a day (QID) | ORAL | Status: DC | PRN

## 2015-02-19 MED ORDER — OXYCODONE-ACETAMINOPHEN 5-325 MG PO TABS
ORAL_TABLET | ORAL | Status: AC
  Filled 2015-02-19: qty 1

## 2015-02-19 MED ORDER — OXYCODONE-ACETAMINOPHEN 5-325 MG PO TABS
1.0000 | ORAL_TABLET | Freq: Once | ORAL | Status: AC
  Administered 2015-02-19: 1 via ORAL
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
## 2015-02-19 NOTE — Discharge Instructions (Signed)
Contusion A contusion is a deep bruise. Contusions are the result of an injury that caused bleeding under the skin. The contusion may turn blue, purple, or yellow. Minor injuries will give you a painless contusion, but more severe contusions may stay painful and swollen for a few weeks.  CAUSES  A contusion is usually caused by a blow, trauma, or direct force to an area of the body. SYMPTOMS   Swelling and redness of the injured area.  Bruising of the injured area.  Tenderness and soreness of the injured area.  Pain. DIAGNOSIS  The diagnosis can be made by taking a history and physical exam. An X-ray, CT scan, or MRI may be needed to determine if there were any associated injuries, such as fractures. TREATMENT  Specific treatment will depend on what area of the body was injured. In general, the best treatment for a contusion is resting, icing, elevating, and applying cold compresses to the injured area. Over-the-counter medicines may also be recommended for pain control. Ask your caregiver what the best treatment is for your contusion. HOME CARE INSTRUCTIONS   Put ice on the injured area.  Put ice in a plastic bag.  Place a towel between your skin and the bag.  Leave the ice on for 15-20 minutes, 3-4 times a day, or as directed by your health care provider.  Only take over-the-counter or prescription medicines for pain, discomfort, or fever as directed by your caregiver. Your caregiver may recommend avoiding anti-inflammatory medicines (aspirin, ibuprofen, and naproxen) for 48 hours because these medicines may increase bruising.  Rest the injured area.  If possible, elevate the injured area to reduce swelling. SEEK IMMEDIATE MEDICAL CARE IF:   You have increased bruising or swelling.  You have pain that is getting worse.  Your swelling or pain is not relieved with medicines. MAKE SURE YOU:   Understand these instructions.  Will watch your condition.  Will get help right  away if you are not doing well or get worse. Document Released: 05/13/2005 Document Revised: 08/08/2013 Document Reviewed: 06/08/2011 [facility] Patient Information 2015 [place], [place]. This information is not intended to replace advice given to you by your health care provider. Make sure you discuss any questions you have with your health care provider.  Abrasions An abrasion is a cut or scrape of the skin. Abrasions do not go through all layers of the skin. HOME CARE  If a bandage (dressing) was put on your wound, change it as told by your doctor. If the bandage sticks, soak it off with warm.  Wash the area with water and soap 2 times a day. Rinse off the soap. Pat the area dry with a clean towel.  Put on medicated cream (ointment) as told by your doctor.  Change your bandage right away if it gets wet or dirty.  Only take medicine as told by your doctor.  See your doctor within 24-48 hours to get your wound checked.  Check your wound for redness, puffiness (swelling), or yellowish-white fluid (pus). GET HELP RIGHT AWAY IF:   You have more pain in the wound.  You have redness, swelling, or tenderness around the wound.  You have pus coming from the wound.  You have a fever or lasting symptoms for more than 2-3 days.  You have a fever and your symptoms suddenly get worse.  You have a bad smell coming from the wound or bandage. MAKE SURE YOU:   Understand these instructions.  Will watch your condition.  Will get help right away if you are not doing well or get worse. Document Released: 01/20/2008 Document Revised: 04/27/2012 Document Reviewed: 07/07/2011 [facility] Patient Information 2015 [place], [place]. This information is not intended to replace advice given to you by your health care provider. Make sure you discuss any questions you have with your health care provider.

## 2015-03-25 ENCOUNTER — Ambulatory Visit (INDEPENDENT_AMBULATORY_CARE_PROVIDER_SITE_OTHER): Payer: BLUE CROSS/BLUE SHIELD | Admitting: Medical

## 2015-03-25 ENCOUNTER — Encounter: Payer: Self-pay | Admitting: Medical

## 2015-03-25 ENCOUNTER — Telehealth: Payer: Self-pay | Admitting: Medical

## 2015-03-25 VITALS — BP 110/68 | HR 68 | Temp 98.0°F | Ht 65.0 in | Wt 183.6 lb

## 2015-03-25 DIAGNOSIS — L97529 Non-pressure chronic ulcer of other part of left foot with unspecified severity: Secondary | ICD-10-CM

## 2015-03-25 DIAGNOSIS — T8130XA Disruption of wound, unspecified, initial encounter: Secondary | ICD-10-CM

## 2015-03-25 MED ORDER — SILVER SULFADIAZINE 1 % EX CREA: 1.0000 "application " | TOPICAL_CREAM | Freq: Every day | CUTANEOUS | Status: DC

## 2015-03-25 NOTE — Patient Instructions (Signed)
Encounter Diagnoses  Name Primary?  Marland Kitchen Foot ulcer, left Yes  . Disruption of wound, initial encounter    Recommendations:  Doreatha Martin out the 2 antibiotics you are taking  Call back with name of the 2 antibiotics  Use soap and water wash twice daily to the wound  Try and use gently scrubbing with wash rag  After blotting the wound dry, use the Silvadene cream generous amount over the wound  Then cover wound with sterile bandage  Change the bandage 1-2 times daily  If not improved by 50% in a week, then call or recheck  Consider rechecking for a physical in general.

## 2015-03-25 NOTE — Telephone Encounter (Signed)
Mom called for information about today's visit, states pt gets confused some after her brain injury.  States she is only on 1 antibiotic and the antibiotic is Cephalexin  1q8h has #9 pills left which is 3 days worth.  Gave mom the instructions that you listed and she will call if wound is not getting better.
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
## 2015-05-02 ENCOUNTER — Telehealth: Payer: Self-pay | Admitting: Medical

## 2015-05-02 NOTE — Telephone Encounter (Signed)
Mom called and needs to know the name of the ortho doctor that you sent pt to for her hip pain.  She is having lots of pain again and they said they could give her some type of injection where the pin is in her hip. But she can't remember the name of the doctor.  Please advise, I can't locate where it was?

## 2015-05-02 NOTE — Telephone Encounter (Signed)
I can't tell if she has insurance or not, but she needs to come in first here for eval.  I don't see where we have discussed hip pain anytime recently

## 2015-05-06 NOTE — Telephone Encounter (Signed)
Pt went to Ortho this morning, she wasn't sure the name of them.

## 2015-05-07 ENCOUNTER — Other Ambulatory Visit: Payer: Self-pay | Admitting: Orthopedic Surgery

## 2015-05-07 DIAGNOSIS — G8929 Other chronic pain: Secondary | ICD-10-CM

## 2015-05-07 DIAGNOSIS — M533 Sacrococcygeal disorders, not elsewhere classified: Principal | ICD-10-CM

## 2015-05-13 ENCOUNTER — Ambulatory Visit
Admission: RE | Admit: 2015-05-13 | Discharge: 2015-05-13 | Disposition: A | Discharge status: Home / Self Care | Payer: BLUE CROSS/BLUE SHIELD | Source: Ambulatory Visit | Attending: Orthopedic Surgery | Admitting: Orthopedic Surgery

## 2015-05-13 DIAGNOSIS — M533 Sacrococcygeal disorders, not elsewhere classified: Principal | ICD-10-CM

## 2015-05-13 DIAGNOSIS — G8929 Other chronic pain: Secondary | ICD-10-CM

## 2015-05-13 MED ORDER — IOHEXOL 180 MG/ML  SOLN
1.0000 mL | Freq: Once | INTRAMUSCULAR | Status: DC | PRN
  Administered 2015-05-13: 1 mL via INTRA_ARTICULAR

## 2015-05-13 MED ORDER — METHYLPREDNISOLONE ACETATE 40 MG/ML INJ SUSP (RADIOLOG
120.0000 mg | Freq: Once | INTRAMUSCULAR | Status: AC
  Administered 2015-05-13: 120 mg via INTRA_ARTICULAR

## 2015-07-30 ENCOUNTER — Ambulatory Visit
Admission: RE | Admit: 2015-07-30 | Discharge: 2015-07-30 | Disposition: A | Discharge status: Home / Self Care | Payer: BLUE CROSS/BLUE SHIELD | Source: Ambulatory Visit | Attending: Medical | Admitting: Medical

## 2015-07-30 ENCOUNTER — Ambulatory Visit (INDEPENDENT_AMBULATORY_CARE_PROVIDER_SITE_OTHER): Payer: BLUE CROSS/BLUE SHIELD | Admitting: Medical

## 2015-07-30 ENCOUNTER — Encounter: Payer: Self-pay | Admitting: Medical

## 2015-07-30 VITALS — BP 108/70 | HR 66 | Wt 183.0 lb

## 2015-07-30 DIAGNOSIS — W19XXXA Unspecified fall, initial encounter: Secondary | ICD-10-CM

## 2015-07-30 DIAGNOSIS — M25571 Pain in right ankle and joints of right foot: Secondary | ICD-10-CM

## 2015-07-30 DIAGNOSIS — M79671 Pain in right foot: Secondary | ICD-10-CM | POA: Diagnosis not present

## 2015-07-30 DIAGNOSIS — M25471 Effusion, right ankle: Secondary | ICD-10-CM | POA: Diagnosis not present

## 2015-07-30 DIAGNOSIS — S99911A Unspecified injury of right ankle, initial encounter: Secondary | ICD-10-CM

## 2015-07-30 DIAGNOSIS — Y92009 Unspecified place in unspecified non-institutional (private) residence as the place of occurrence of the external cause: Secondary | ICD-10-CM

## 2015-07-30 DIAGNOSIS — Y92099 Unspecified place in other non-institutional residence as the place of occurrence of the external cause: Secondary | ICD-10-CM | POA: Diagnosis not present

## 2015-07-30 MED ORDER — HYDROCODONE-ACETAMINOPHEN 7.5-325 MG PO TABS: 1.0000 | ORAL_TABLET | Freq: Four times a day (QID) | ORAL | Status: DC | PRN

## 2015-07-30 MED ORDER — NAPROXEN 500 MG PO TABS: 500.0000 mg | ORAL_TABLET | Freq: Two times a day (BID) | ORAL | Status: DC

## 2015-07-30 NOTE — Progress Notes (Signed)
Subjective: Chief Complaint  Patient presents with  . fell down stairs    hurt rt foot. said that one foot has a screw but has no idea which that is.    Here for injury to right foot and ankle, 2 separate recent injuries.  07/28/15 Sunday was walking by her couch, and foot tripped on couch.   At that time has bruising and swelling within 30 minutes of right foot, pain in forefoot.   Then on Monday had on socks, ended up sliding down 3 stairs in her house and turned the right ankle, re injuring the foot and ankle . This time had worse bruising and swelling .  She has hx/o traumatic brain injury, and has history of problems with foot and ankle problems from time to time, weakness or tripping over things.   Has used ice a few times, OTC analgesics a few times.  No other aggravating or relieving factors. No other complaint.  ROS as in subjective   Objective: BP 108/70 mmHg  Pulse 66  Wt 183 lb (83.008 kg)  LMP 07/30/2015  Gen: wd, wn, nad Skin: purplish bruising of right ankle and along base of foot and MTPs ZOX:[id number]:generalized swelling of right ankle laterally and foot, tender over entire lateal malleolus, dorsal medial and lateral foot, tender somewhat over 3rd -5th MTPs, tender over 3rd - 5th tarsals, somewhat decreased ROM of toes, decreased ankle ROM, pain with ankle dorsiflexion and eversion, rest of right leg exam unremarkable. Left leg exam unremarkable Pedal pulse normal Seemingly normal right foot sensation.  strength testing somewhat decreased due to pain.    Assessment: Encounter Diagnoses  Name Primary?  . Right ankle pain Yes  . Right ankle injury, initial encounter   . Right ankle swelling   . Right foot pain   . Fall at home, initial encounter      Plan: Discussed exam findings, mechanism of injury, possibility of fracture and/or severe sprain.   Will send for xray.  Can use Hydrocodone prn pain, advised ice, elevation, avoid re injury, NSAID prescribed Naprosyn.  F/u  pending xray.  Yvonna AlanisKaitlyn was seen today for fell down stairs.  Diagnoses and all orders for this visit:  Right ankle pain -     DG Ankle Complete Right; Future -     DG Foot Complete Right; Future  Right ankle injury, initial encounter -     DG Ankle Complete Right; Future -     DG Foot Complete Right; Future  Right ankle swelling -     DG Ankle Complete Right; Future -     DG Foot Complete Right; Future  Right foot pain -     DG Ankle Complete Right; Future -     DG Foot Complete Right; Future  Fall at home, initial encounter -     DG Ankle Complete Right; Future -     DG Foot Complete Right; Future  Other orders -     naproxen (NAPROSYN) 500 MG tablet; Take 1 tablet (500 mg total) by mouth 2 (two) times daily with a meal. -     HYDROcodone-acetaminophen (NORCO) 7.5-325 MG tablet; Take 1 tablet by mouth every 6 (six) hours as needed for moderate pain.

## 2015-08-27 ENCOUNTER — Telehealth: Payer: Self-pay

## 2015-08-27 NOTE — Telephone Encounter (Signed)
Patient has not had flu vaccine, she will call back and schedule an appointment to have the vaccine.

## 2015-09-06 ENCOUNTER — Other Ambulatory Visit (INDEPENDENT_AMBULATORY_CARE_PROVIDER_SITE_OTHER): Payer: BLUE CROSS/BLUE SHIELD

## 2015-09-06 DIAGNOSIS — Z23 Encounter for immunization: Secondary | ICD-10-CM

## 2015-11-18 ENCOUNTER — Encounter: Payer: Self-pay | Admitting: Family Medicine

## 2015-11-18 ENCOUNTER — Ambulatory Visit (INDEPENDENT_AMBULATORY_CARE_PROVIDER_SITE_OTHER): Payer: BLUE CROSS/BLUE SHIELD | Admitting: Family Medicine

## 2015-11-18 VITALS — BP 118/70 | HR 64 | Wt 194.0 lb

## 2015-11-18 DIAGNOSIS — K219 Gastro-esophageal reflux disease without esophagitis: Secondary | ICD-10-CM

## 2015-11-18 NOTE — Patient Instructions (Signed)
Take to Nexium daily for the next week and if still having difficulty call me. We will only need this intermittently after

## 2015-11-18 NOTE — Progress Notes (Signed)
   Subjective:    Patient ID: Taylor Green, female    DOB: 01/04/1991, 25 y.o.   MRN: [id number]  HPI Complains of a 6 month history of acid indigestion, chest burning and pain with occasional vomiting. She has tried famotidine and taken high doses of this without much success. She cannot relate this to a particular foods. Has been no bloating, gas or diarrhea. She does not smoke.   Review of Systems     Objective:   Physical Exam Alert and in no distress. Tympanic membranes and canals are normal. Pharyngeal area is normal. Neck is supple without adenopathy or thyromegaly. Cardiac exam shows a regular sinus rhythm without murmurs or gallops. Lungs are clear to auscultation. Abdominal exam shows no masses or tenderness with decreased bowel sounds       Assessment & Plan:  Gastroesophageal reflux disease, esophagitis presence not specified Samples of Nexium given. She is to take equivalent to 40 mg daily and call me in 1 week. Stressed the fact that if this continues, further intervention and workup will be needed.

## 2015-12-30 ENCOUNTER — Other Ambulatory Visit: Payer: Self-pay | Admitting: Family Medicine

## 2015-12-30 ENCOUNTER — Telehealth: Payer: Self-pay | Admitting: Family Medicine

## 2015-12-30 ENCOUNTER — Other Ambulatory Visit: Payer: Self-pay

## 2015-12-30 MED ORDER — ESOMEPRAZOLE MAGNESIUM 40 MG PO CPDR: 40.0000 mg | DELAYED_RELEASE_CAPSULE | Freq: Every day | ORAL | Status: DC

## 2015-12-30 NOTE — Telephone Encounter (Signed)
I will give her a 2 week supply. If she has continued difficulty, we will need to refer for further evaluation since she is so young

## 2015-12-30 NOTE — Telephone Encounter (Signed)
Dr.lalonde is this okay looks like you gave her 40 mg nexium

## 2015-12-30 NOTE — Telephone Encounter (Signed)
Pt's mother called and stated that at pt's last OV she was given samples of Nexium. She states she was to call and let us know if it worked ok. Per pt's mom it did work well and they would like a rx called in for her. [facility] [address] and can be reached at [phone].

## 2015-12-30 NOTE — Telephone Encounter (Signed)
Pt informed and verbalized understanding

## 2016-01-20 ENCOUNTER — Encounter: Payer: Self-pay | Admitting: Medical

## 2016-01-20 ENCOUNTER — Ambulatory Visit (INDEPENDENT_AMBULATORY_CARE_PROVIDER_SITE_OTHER): Payer: BLUE CROSS/BLUE SHIELD | Admitting: Medical

## 2016-01-20 VITALS — BP 120/80 | HR 115 | Wt 199.0 lb

## 2016-01-20 DIAGNOSIS — F411 Generalized anxiety disorder: Secondary | ICD-10-CM

## 2016-01-20 DIAGNOSIS — Z87820 Personal history of traumatic brain injury: Secondary | ICD-10-CM
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
## 2016-01-20 NOTE — Patient Instructions (Signed)
Recommendations:  I recommend an updated consult with neurology or neuropsychiatry to get an updated assessment on your neurological and psychiatry status  You saw Dr. Faith RogueZachary Swartz in the past back in 2012 regarding brain injury, attention deficit,and behavior problems.   I recommend routine yearly physical for general health care.    In recent years you have only been here for acute issues, no routine preventative health

## 2016-02-07 ENCOUNTER — Telehealth: Payer: Self-pay | Admitting: Family Medicine

## 2016-02-07 NOTE — Telephone Encounter (Signed)
Pt's mother called stating that is trying to go see [facility] ob[facility] @ [address], [phone]) on Monday for her annual exam & a problem that she had this week however she needs a referral from her primary doctor [facility]

## 2016-02-07 NOTE — Telephone Encounter (Signed)
Called [facility] and just had to give them the NPI # for the visit

## 2016-02-10 DIAGNOSIS — Z113 Encounter for screening for infections with a predominantly sexual mode of transmission: Secondary | ICD-10-CM | POA: Diagnosis not present

## 2016-02-10 DIAGNOSIS — Z202 Contact with and (suspected) exposure to infections with a predominantly sexual mode of transmission: Secondary | ICD-10-CM | POA: Diagnosis not present

## 2016-03-23 DIAGNOSIS — Z113 Encounter for screening for infections with a predominantly sexual mode of transmission: Secondary | ICD-10-CM | POA: Diagnosis not present

## 2016-03-23 DIAGNOSIS — Z202 Contact with and (suspected) exposure to infections with a predominantly sexual mode of transmission: Secondary | ICD-10-CM | POA: Diagnosis not present

## 2016-03-23 DIAGNOSIS — Z8619 Personal history of other infectious and parasitic diseases: Secondary | ICD-10-CM | POA: Diagnosis not present

## 2016-04-22 ENCOUNTER — Encounter: Payer: Self-pay | Admitting: Family Medicine

## 2016-04-22 ENCOUNTER — Ambulatory Visit (INDEPENDENT_AMBULATORY_CARE_PROVIDER_SITE_OTHER): Payer: BLUE CROSS/BLUE SHIELD | Admitting: Family Medicine

## 2016-04-22 VITALS — BP 118/78 | HR 84 | Temp 98.1°F | Wt 193.0 lb

## 2016-04-22 DIAGNOSIS — J029 Acute pharyngitis, unspecified: Secondary | ICD-10-CM

## 2016-04-22 DIAGNOSIS — H6691 Otitis media, unspecified, right ear: Secondary | ICD-10-CM

## 2016-04-22 LAB — POCT RAPID STREP A (OFFICE): RAPID STREP A SCREEN: NEGATIVE

## 2016-04-22 MED ORDER — AMOXICILLIN 875 MG PO TABS: 875.0000 mg | ORAL_TABLET | Freq: Two times a day (BID) | ORAL | 0 refills | Status: DC

## 2016-04-22 NOTE — Patient Instructions (Signed)
Take all the antibiotic and if not totally back to normal when you finish call me °

## 2016-04-22 NOTE — Progress Notes (Signed)
   Subjective:    Patient ID: Taylor Green, female    DOB: 10/28/1990, 25 y.o.   MRN: [id number]  HPI She has a one-day history of nasal congestion and sore throat, earache but no fever, chills or chest congestion   Review of Systems     Objective:   Physical Exam Alert and in no distress. Tympanic membrane on the right is slightly red and bulging, left is normal, canals are normal. Pharyngeal area is normal. Neck is supple without adenopathy or thyromegaly. Cardiac exam shows a regular sinus rhythm without murmurs or gallops. Lungs are clear to auscultation. Screen is negative       Assessment & Plan:  Acute right otitis media, recurrence not specified, unspecified otitis media type - Plan: amoxicillin (AMOXIL) 875 MG tablet  Acute pharyngitis, unspecified etiology - Plan: POCT rapid strep A She is to call if not entirely better when she finishes the antibiotic.

## 2016-07-03 ENCOUNTER — Encounter: Payer: Self-pay | Admitting: Family Medicine

## 2016-07-03 ENCOUNTER — Ambulatory Visit (INDEPENDENT_AMBULATORY_CARE_PROVIDER_SITE_OTHER): Payer: BLUE CROSS/BLUE SHIELD | Admitting: Family Medicine

## 2016-07-03 VITALS — BP 120/80 | HR 122 | Ht 65.0 in | Wt 184.0 lb

## 2016-07-03 DIAGNOSIS — Z87828 Personal history of other (healed) physical injury and trauma: Secondary | ICD-10-CM | POA: Diagnosis not present

## 2016-07-03 DIAGNOSIS — F411 Generalized anxiety disorder: Secondary | ICD-10-CM | POA: Diagnosis not present

## 2016-07-03 NOTE — Progress Notes (Signed)
   Subjective:    Patient ID: Taylor Green, female    DOB: 02/18/1991, 25 y.o.   MRN: [id number]  HPI She is here for consult concerning using Adderall. She has a previous history of a traumatic brain injury and subsequent difficulty with anxiety. She has tried various medications in the past with little success. Note from June by Taylor Green was reviewed and gives a very good history concerning this. Recently she started reading about Adderall and its potential effects. She obtained a pill from a friend and noticed that it did get rid of her anxiety, help her with focus as well as with social interactions, all of which she has had difficulty in the past with. Apparently prior to her head injury, she was an excellent student with no symptoms of ADD or anxiety. She has apparently been evaluated by psychology as well as neuropsychology and apparently is disabled because of this. She is interested in getting placed on Adderall. She also admits to occasional marijuana use.   Review of Systems     Objective:   Physical Exam Alert and in no distress with appropriate affect.       Assessment & Plan:  Anxiety state  History of traumatic injury of head  Apparently none of her present psychological symptoms occurred prior to her head injury and since then she has been on multiple meds with no success but did benefit greatly from one Adderall. I explained that I'm reluctant to then place her on Adderall based on this alone and would like to get a psychological evaluation. She has seen Dr. Dellia Green in the past and I will refer back for his insight into this.

## 2016-07-13 ENCOUNTER — Telehealth: Payer: Self-pay

## 2016-07-13 NOTE — Telephone Encounter (Signed)
Pt has appointment Dec. 4th Taylor Green

## 2016-07-20 ENCOUNTER — Ambulatory Visit (INDEPENDENT_AMBULATORY_CARE_PROVIDER_SITE_OTHER): Payer: No Typology Code available for payment source | Admitting: Psychology

## 2016-07-20 ENCOUNTER — Telehealth: Payer: Self-pay | Admitting: Family Medicine

## 2016-07-20 DIAGNOSIS — F411 Generalized anxiety disorder: Secondary | ICD-10-CM | POA: Diagnosis not present

## 2016-07-20 NOTE — Telephone Encounter (Signed)
Please call Mother ( on [place])  Patient saw Dr. Dellia CloudGutterman and she doesn't feel he understood her case and  Was just trying to send her back to places she has already been Mother would like to talk with you

## 2016-09-07 ENCOUNTER — Ambulatory Visit (INDEPENDENT_AMBULATORY_CARE_PROVIDER_SITE_OTHER): Payer: BLUE CROSS/BLUE SHIELD | Admitting: Family Medicine

## 2016-09-07 ENCOUNTER — Encounter: Payer: Self-pay | Admitting: Family Medicine

## 2016-09-07 VITALS — BP 118/86 | HR 100 | Temp 100.0°F | Ht 65.0 in | Wt 170.4 lb

## 2016-09-07 DIAGNOSIS — J069 Acute upper respiratory infection, unspecified: Secondary | ICD-10-CM

## 2016-09-07 NOTE — Patient Instructions (Signed)
  Drink plenty of fluids. Continue decongestant (phenylephrine or pseudoephedrine), expectorant (guaifenesin) and cough suppressant (dextromethorphan).  Look for these ingredients in the medications you are taking.  You current medication seems to be helping--use as directed on bottle. Tylenol or ibuprofen as needed for pain or fever.  Your OTC medication may contain acetaminophen (tylenol)--be sure not to duplicate medications/ingredients.  Return if increasing/persisting fevers, worsening ear pain/drainage, persistent discolored mucus/phlegm, worsening cough, shortness of breath, etc. Consider sinus rinses. Please try and quit smoking.  Please try and quit smoking--start thinking about why/when you smoke (habit, boredom, stress) in order to come up with effective strategies to cut back or quit. Available resources to help you quit include free counseling through [facility] ([place]uit[place] or 1-800-QUITNOW), smoking cessation classes through [facility] (call to find out schedule), over-the-counter nicotine replacements, and e-cigarettes (although this may not help break the hand-mouth habit).  Many insurance companies also have smoking cessation programs (which may decrease the cost of patches, meds if enrolled).  If these methods are not effective for you, and you are motivated to quit, return to discuss the possibility of prescription medications.

## 2016-09-07 NOTE — Progress Notes (Signed)
Chief Complaint  Patient presents with  . Sore Throat    started Thursday. Some cough, no fevers-mucus is yellow in color. ST is all day long and still present but she thinks it's from drainage, concerned about her chest mostly. No chills or body aches. Ears feel fully and stuffy.    3 days ago she started with fatigue, felt weak, then developed cough, congestion.  Nasal drainage is mostly clear.  Cough is productive of yellow phlegm, throughout the day, small amounts. Denies pleuritic pain or shortness of breath.  Chest feels slightly tight/congested.  Right ear has been "draining", watery drainage.  Had some ear pain yesterday, not hurting currently.  Alka selzer OTC medication she took last night helped.  In the past when she had similar symptoms, she had an ear infection, so presents for evaluation.  +sick co-workers (colds); works at [employer].  PMH, PSH, SH reviewed +smoker  Outpatient Encounter Prescriptions as of 09/07/2016  Medication Sig  . Etonogestrel (IMPLANON [place]) Inject into the skin.  Marland Kitchen. Phenyleph-CPM-DM-Aspirin (ALKA-SELTZER PLUS COLD & COUGH PO) Take 2 tablets by mouth as needed.   No facility-administered encounter medications on file as of 09/07/2016.    No Known Allergies  ROS: no known fever, chills.  +fatigue.  No body aches. +intermittent ear pain and drainage, sore throat and cough. No nausea, vomiting, diarrhea, bleeding, bruising, skin rash No chest pain, shortness of breath, swelling or other concerns.  PHYSICAL EXAM:  BP 118/86 (BP Location: Left Arm, Patient Position: Sitting, Cuff Size: Normal)   Pulse 100   Temp 100 F (37.8 C) (Tympanic)   Ht 5\' 5"  (1.651 m)   Wt 170 lb 6.4 oz (77.3 kg)   BMI 28.36 kg/m   Well developed, pleasant female in no distress.  Well-appearing HEENT: PERRL, EOMI, conjunctiva and sclera clear.  Right TM--scarred inferiorly.  At first I suspected an old perforation, but light reflex is present.  No drainage seen, no  erythema.  Cannot r/o small old perforation. L TM--slight scarring inferiorly otherwise normal.  EAC normal Nasal mucosa is moderately edematous with clear/white mucus Sinuses nontender OP is clear Neck: no lymphadenopathy or mass Heart: regular rate and rhythm without murmur Lungs: clear bilaterally; no wheezes, rales, ronchi, good air movement Psych: normal mood, affect, hygiene and grooming Neuro: alert and oriented, cranial nerves intact, normal strength, gait  ASSESSMENT/PLAN:  Acute upper respiratory infection  Supportive measures reviewed. No evidence of acute infection.  Risks of smoking reviewed and encouraged cessation. Re: clear ear drainage--no evidence of infection--cannot r/o small residual perforation from prior infection.   Drink plenty of fluids. Continue decongestant (phenylephrine or pseudoephedrine), expectorant (guaifenesin) and cough suppressant (dextromethorphan).  Look for these ingredients in the medications you are taking.  You current medication seems to be helping--use as directed on bottle. Tylenol or ibuprofen as needed for pain or fever.  Your OTC medication may contain acetaminophen (tylenol)--be sure not to duplicate medications/ingredients.  Return if increasing/persisting fevers, worsening ear pain/drainage, persistent discolored mucus/phlegm, worsening cough, shortness of breath, etc. Consider sinus rinses. Please try and quit smoking.

## 2016-10-07 ENCOUNTER — Ambulatory Visit (INDEPENDENT_AMBULATORY_CARE_PROVIDER_SITE_OTHER): Payer: BLUE CROSS/BLUE SHIELD | Admitting: Family Medicine

## 2016-10-07 ENCOUNTER — Encounter: Payer: Self-pay | Admitting: Family Medicine

## 2016-10-07 VITALS — BP 106/70 | HR 86 | Temp 98.3°F | Wt 168.0 lb

## 2016-10-07 DIAGNOSIS — H6641 Suppurative otitis media, unspecified, right ear: Secondary | ICD-10-CM

## 2016-10-07 MED ORDER — AMOXICILLIN 875 MG PO TABS: 875.0000 mg | ORAL_TABLET | Freq: Two times a day (BID) | ORAL | 0 refills | Status: DC

## 2016-10-07 NOTE — Progress Notes (Signed)
   Subjective:    Patient ID: Taylor Green, female    DOB: 06/20/1991, 26 y.o.   MRN: [id number]  HPI She complains of a 2 day history of right earache but no nasal congestion, PND, sore throat, fever, chills, cough.   Review of Systems     Objective:   Physical Exam Alert and in no distress. Tympanic membrane on the left is normal, right is dull and red canals are normal. Pharyngeal area is normal. Neck is supple without adenopathy or thyromegaly. Cardiac exam shows a regular sinus rhythm without murmurs or gallops. Lungs are clear to auscultation.        Assessment & Plan:  Suppurative otitis media of right ear, unspecified chronicity - Plan: amoxicillin (AMOXIL) 875 MG tablet Call if not better when she finishes the antibiotic.

## 2016-10-28 ENCOUNTER — Telehealth: Payer: Self-pay | Admitting: Family Medicine

## 2016-10-28 NOTE — Telephone Encounter (Signed)
Pt's mother called and stated at pt's last office visit it was discussed putting pt on Adderall. It was mother's understanding that this needed to be discussed with Dr. Dellia CloudGutterman. At pt's last visit with Dr. Dellia CloudGutterman he stated he was ok with her being on medication. Dr. Dellia CloudGutterman was to call and discuss with JCL. Mother would like to know status of that and if she can now be placed on medication. Please advise mother at [phone].

## 2016-10-29 NOTE — Telephone Encounter (Signed)
I have not talked to Dr. Dellia CloudGutterman. Have him call me and I need to make sure there is a note in the chart from him concerning this.

## 2016-10-30 NOTE — Telephone Encounter (Signed)
Mom  Informed we have not received note or call she said she will call them and make sure note is faxed and she would like to know after you get note if you will fill med for [facility]

## 2016-11-05 NOTE — Telephone Encounter (Signed)
Dr.Lalonde Dr.Gutterman has received the message and has all info and chart if he doesn't get to it today he will by next week his nurse called to inform us.

## 2017-01-15 ENCOUNTER — Telehealth: Payer: Self-pay | Admitting: Family Medicine

## 2017-01-15 ENCOUNTER — Other Ambulatory Visit: Payer: Self-pay

## 2017-01-15 MED ORDER — CLONAZEPAM 0.5 MG PO TABS: 0.5000 mg | ORAL_TABLET | Freq: Two times a day (BID) | ORAL | 0 refills | Status: AC | PRN

## 2017-01-15 NOTE — Telephone Encounter (Signed)
Called in klonopin per [employer]

## 2017-01-15 NOTE — Telephone Encounter (Signed)
Pt's mom, Eunice BlaseDebbie, called stating that their dog passed away a few days ago and pt is having a difficult time dealing with the loss and a difficult time sleeping. Mom wants to know if pt can get a few days of a mild med to help calm, relax her & help her sleep

## 2017-01-15 NOTE — Telephone Encounter (Signed)
She is 26 years old call and talk to her and see what her thoughts are. If she needs some medicine that's fine

## 2017-01-15 NOTE — Telephone Encounter (Signed)
I called mom to get kaitlyns number [phone] talked with her and she said she wanted clonopin to help Dr.Lalonde told me to  Call it in

## 2017-09-10 ENCOUNTER — Encounter: Payer: Self-pay | Admitting: Medical

## 2017-09-10 ENCOUNTER — Ambulatory Visit (INDEPENDENT_AMBULATORY_CARE_PROVIDER_SITE_OTHER): Payer: BLUE CROSS/BLUE SHIELD | Admitting: Medical

## 2017-09-10 VITALS — BP 132/82 | HR 102 | Temp 98.9°F | Wt 159.6 lb

## 2017-09-10 DIAGNOSIS — H6641 Suppurative otitis media, unspecified, right ear: Secondary | ICD-10-CM

## 2017-09-10 DIAGNOSIS — R6889 Other general symptoms and signs: Secondary | ICD-10-CM | POA: Diagnosis not present

## 2017-09-10 LAB — POC INFLUENZA A&B (BINAX/QUICKVUE)
INFLUENZA B, POC: NEGATIVE
Influenza A, POC: NEGATIVE

## 2017-09-10 MED ORDER — AMOXICILLIN 875 MG PO TABS: 875.0000 mg | ORAL_TABLET | Freq: Two times a day (BID) | ORAL | 0 refills | Status: DC
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
## 2017-10-15 ENCOUNTER — Encounter: Payer: Self-pay | Admitting: Medical

## 2017-10-15 ENCOUNTER — Telehealth: Payer: Self-pay | Admitting: Medical

## 2017-10-15 ENCOUNTER — Telehealth: Payer: Self-pay
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
## 2017-10-15 NOTE — Telephone Encounter (Signed)
Pt was called to  Let her know that a letter is ready for pick up. Thanks [employer]

## 2018-02-16 DIAGNOSIS — Z113 Encounter for screening for infections with a predominantly sexual mode of transmission: Secondary | ICD-10-CM | POA: Diagnosis not present

## 2018-02-16 DIAGNOSIS — Z3046 Encounter for surveillance of implantable subdermal contraceptive: Secondary | ICD-10-CM | POA: Diagnosis not present

## 2018-02-16 DIAGNOSIS — Z202 Contact with and (suspected) exposure to infections with a predominantly sexual mode of transmission: Secondary | ICD-10-CM | POA: Diagnosis not present

## 2018-03-27 ENCOUNTER — Other Ambulatory Visit: Payer: Self-pay

## 2018-03-27 ENCOUNTER — Emergency Department
Admission: EM | Admit: 2018-03-27 | Discharge: 2018-03-27 | Disposition: A | Discharge status: Home / Self Care | Payer: BLUE CROSS/BLUE SHIELD | Attending: Emergency Medicine | Admitting: Emergency Medicine

## 2018-03-27 DIAGNOSIS — F17210 Nicotine dependence, cigarettes, uncomplicated: Secondary | ICD-10-CM | POA: Insufficient documentation

## 2018-03-27 DIAGNOSIS — F1523 Other stimulant dependence with withdrawal: Secondary | ICD-10-CM | POA: Insufficient documentation

## 2018-03-27 DIAGNOSIS — R110 Nausea: Secondary | ICD-10-CM | POA: Insufficient documentation

## 2018-03-27 DIAGNOSIS — R51 Headache: Secondary | ICD-10-CM | POA: Diagnosis not present

## 2018-03-27 DIAGNOSIS — R531 Weakness: Secondary | ICD-10-CM | POA: Insufficient documentation

## 2018-03-27 DIAGNOSIS — F1593 Other stimulant use, unspecified with withdrawal: Secondary | ICD-10-CM

## 2018-03-27 LAB — URINALYSIS, COMPLETE (UACMP) WITH MICROSCOPIC
BILIRUBIN URINE: NEGATIVE
Glucose, UA: NEGATIVE mg/dL
Hgb urine dipstick: NEGATIVE
Ketones, ur: NEGATIVE mg/dL
Leukocytes, UA: NEGATIVE
Nitrite: NEGATIVE
Protein, ur: NEGATIVE mg/dL
Specific Gravity, Urine: 1.023 (ref 1.005–1.030)
pH: 6 (ref 5.0–8.0)

## 2018-03-27 LAB — URINE DRUG SCREEN, QUALITATIVE (ARMC ONLY)
Amphetamines, Ur Screen: POSITIVE — AB
BARBITURATES, UR SCREEN: NOT DETECTED
CANNABINOID 50 NG, UR ~~LOC~~: POSITIVE — AB
COCAINE METABOLITE, UR ~~LOC~~: NOT DETECTED
MDMA (ECSTASY) UR SCREEN: NOT DETECTED
Methadone Scn, Ur: NOT DETECTED
Opiate, Ur Screen: NOT DETECTED
Phencyclidine (PCP) Ur S: NOT DETECTED
TRICYCLIC, UR SCREEN: NOT DETECTED

## 2018-03-27 LAB — SALICYLATE LEVEL: Salicylate Lvl: <7 mg/dL (ref 2.8–30.0)

## 2018-03-27 LAB — COMPREHENSIVE METABOLIC PANEL
ALT: 18 U/L (ref 0–44)
AST: 15 U/L (ref 15–41)
Albumin: 4 g/dL (ref 3.5–5.0)
Alkaline Phosphatase: 46 U/L (ref 38–126)
Anion gap: 6 (ref 5–15)
BUN: 13 mg/dL (ref 6–20)
CO2: 29 mmol/L (ref 22–32)
CREATININE: 0.77 mg/dL (ref 0.44–1.00)
Calcium: 9 mg/dL (ref 8.9–10.3)
Chloride: 106 mmol/L (ref 98–111)
Glucose, Bld: 96 mg/dL (ref 70–99)
POTASSIUM: 4.4 mmol/L (ref 3.5–5.1)
Sodium: 141 mmol/L (ref 135–145)
Total Bilirubin: 0.5 mg/dL (ref 0.3–1.2)
Total Protein: 6.8 g/dL (ref 6.5–8.1)

## 2018-03-27 LAB — CBC
HCT: 40.2 % (ref 35.0–47.0)
Hemoglobin: 14.2 g/dL (ref 12.0–16.0)
MCH: 33.9 pg (ref 26.0–34.0)
MCHC: 35.4 g/dL (ref 32.0–36.0)
MCV: 96 fL (ref 80.0–100.0)
PLATELETS: 160 10*3/uL (ref 150–440)
RBC: 4.19 MIL/uL (ref 3.80–5.20)
RDW: 13.1 % (ref 11.5–14.5)
WBC: 5.4 10*3/uL (ref 3.6–11.0)

## 2018-03-27 LAB — ACETAMINOPHEN LEVEL: Acetaminophen (Tylenol), Serum: <10 ug/mL — ABNORMAL LOW (ref 10–30)

## 2018-03-27 MED ORDER — METOCLOPRAMIDE HCL 5 MG/ML IJ SOLN
10.0000 mg | Freq: Once | INTRAMUSCULAR | Status: AC
  Administered 2018-03-27: 10 mg via INTRAVENOUS
  Filled 2018-03-27: qty 2

## 2018-03-27 MED ORDER — SODIUM CHLORIDE 0.9 % IV BOLUS
1000.0000 mL | Freq: Once | INTRAVENOUS | Status: AC
  Administered 2018-03-27: 1000 mL via INTRAVENOUS

## 2018-03-27 MED ORDER — METOCLOPRAMIDE HCL 10 MG PO TABS: 10.0000 mg | ORAL_TABLET | Freq: Three times a day (TID) | ORAL | 0 refills | Status: DC | PRN

## 2018-03-27 MED ORDER — KETOROLAC TROMETHAMINE 30 MG/ML IJ SOLN
30.0000 mg | Freq: Once | INTRAMUSCULAR | Status: AC
  Administered 2018-03-27: 30 mg via INTRAVENOUS
  Filled 2018-03-27: qty 1

## 2018-03-27 NOTE — ED Triage Notes (Signed)
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 States she last used about 3 days ago. The people that she was "hanging around with got arrested" and she is now vomiting due to lack of availability. Her mother accompanies her and states she has been having a bad headache as well as vomiting that started this morning. Has had one episode of vomiting today. Loss of appetite to accompany the symptoms but denies diarrhea.

## 2018-03-27 NOTE — ED Notes (Signed)
Pt resting with eyes closed. Arouses easily to voice. No co's at this time.
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
## 2018-03-27 NOTE — Discharge Instructions (Addendum)
Return to the ER for any new or worsening symptoms that concern you.  We have provided a list of resources for outpatient counseling.

## 2018-07-20 ENCOUNTER — Emergency Department (HOSPITAL_COMMUNITY)
Admission: EM | Admit: 2018-07-20 | Discharge: 2018-07-20 | Disposition: A | Discharge status: Home / Self Care | Payer: BLUE CROSS/BLUE SHIELD | Attending: Emergency Medicine | Admitting: Emergency Medicine

## 2018-07-20 ENCOUNTER — Encounter (HOSPITAL_COMMUNITY): Payer: Self-pay | Admitting: Emergency Medicine

## 2018-07-20 DIAGNOSIS — Y92410 Unspecified street and highway as the place of occurrence of the external cause: Secondary | ICD-10-CM | POA: Insufficient documentation

## 2018-07-20 DIAGNOSIS — Z5321 Procedure and treatment not carried out due to patient leaving prior to being seen by health care provider: Secondary | ICD-10-CM | POA: Insufficient documentation

## 2018-07-20 DIAGNOSIS — M549 Dorsalgia, unspecified: Secondary | ICD-10-CM | POA: Insufficient documentation

## 2018-07-20 DIAGNOSIS — Y939 Activity, unspecified: Secondary | ICD-10-CM | POA: Insufficient documentation

## 2018-07-20 DIAGNOSIS — Y998 Other external cause status: Secondary | ICD-10-CM | POA: Diagnosis not present
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
## 2020-09-09 ENCOUNTER — Other Ambulatory Visit: Payer: BLUE CROSS/BLUE SHIELD

## 2021-02-14 HISTORY — PX: DILATION AND CURETTAGE OF UTERUS: SHX78

## 2021-04-01 ENCOUNTER — Other Ambulatory Visit: Payer: Self-pay

## 2021-04-01 ENCOUNTER — Ambulatory Visit (INDEPENDENT_AMBULATORY_CARE_PROVIDER_SITE_OTHER): Payer: Medicaid Other | Admitting: Obstetrics and Gynecology

## 2021-04-01 ENCOUNTER — Encounter: Payer: Self-pay | Admitting: Obstetrics and Gynecology

## 2021-04-01 ENCOUNTER — Other Ambulatory Visit (HOSPITAL_COMMUNITY)
Admission: RE | Admit: 2021-04-01 | Discharge: 2021-04-01 | Disposition: A | Discharge status: Home / Self Care | Payer: Medicaid Other | Source: Ambulatory Visit | Attending: Obstetrics and Gynecology | Admitting: Obstetrics and Gynecology

## 2021-04-01 VITALS — BP 147/96 | HR 103 | Ht 66.0 in | Wt 176.2 lb

## 2021-04-01 DIAGNOSIS — R030 Elevated blood-pressure reading, without diagnosis of hypertension: Secondary | ICD-10-CM | POA: Diagnosis not present

## 2021-04-01 DIAGNOSIS — Z3046 Encounter for surveillance of implantable subdermal contraceptive: Secondary | ICD-10-CM | POA: Diagnosis not present

## 2021-04-01 DIAGNOSIS — Z124 Encounter for screening for malignant neoplasm of cervix: Secondary | ICD-10-CM | POA: Diagnosis not present

## 2021-04-01 DIAGNOSIS — Z30017 Encounter for initial prescription of implantable subdermal contraceptive: Secondary | ICD-10-CM | POA: Diagnosis not present

## 2021-04-01 MED ORDER — ETONOGESTREL 68 MG ~~LOC~~ IMPL
68.0000 mg | DRUG_IMPLANT | Freq: Once | SUBCUTANEOUS | Status: AC
  Administered 2021-04-01: 68 mg via SUBCUTANEOUS

## 2021-04-01 NOTE — Progress Notes (Signed)
error 

## 2021-04-01 NOTE — Procedures (Signed)
Nexplanon Removal and Insertion Procedure Note Patient states she's had the nexplanon since 2011 and likes it and would like to get another one. She was amenorrheic on it when it was up to date but currently has regular monthly periods. She had an elective AB via in office procedure a few weeks ago; no sex since then. Patient also unsure of last pap smear and is amenable to pap today; pt declines STD testing   Prior to the procedure being performed, the patient was asked to state their full name, date of birth, type of procedure being performed and the exact location of the operative site. This information was then checked against the documentation in the patient's chart. Prior to the procedure being performed, a "time out" was performed by the physician that confirmed the correct patient, procedure and site.  After informed consent was obtained, the patient was placed in dorsal lithotomy and a Pederson speculum placed. Normal EGBUS, vagina and cervix; pap smear obtained.  The patient's left arm was examined and the Nexplanon rod was noted to be easily palpable. The area was cleaned with alcohol then local anesthesia was infiltrated with 3 ml of 1% lidocaine with epinephrine. The area was prepped with betadine. Using sterile technique, the Nexplanon device was brought to the incision site. The capsule was scrapped off with the scalpel, the Nexplanon grasped with hemostats, and easily removed; the removal site was hemostatic, and the nexplanon was inspected and noted to be intact.The area was re-swabbed with betadine and another 65mL of 1% lidocaine with epinephrine was now injected along the insertion site.  Using sterile technique the Nexplanon device was inserted per manufacturer's guidelines in the subdermal connective tissue using the standard insertion technique without difficulty. Pressure was applied and the insertion site was hemostatic. The presence of the Nexplanon was confirmed immediately after  insertion by palpation by both me and the patient and by checking the tip of needle for the absence of the insert.  A pressure dressing was applied.  The patient tolerated the procedure well and was told to consider it effective in a week.   Cornelia Copa MD Attending Center for [employer] [facility])

## 2021-04-07 LAB — CYTOLOGY - PAP
Comment: NEGATIVE
Diagnosis: UNDETERMINED — AB
High risk HPV: NEGATIVE

## 2021-06-05 ENCOUNTER — Ambulatory Visit (INDEPENDENT_AMBULATORY_CARE_PROVIDER_SITE_OTHER): Payer: Self-pay | Admitting: *Deleted

## 2021-06-05 ENCOUNTER — Other Ambulatory Visit: Payer: Self-pay

## 2021-06-05 ENCOUNTER — Other Ambulatory Visit (HOSPITAL_COMMUNITY)
Admission: RE | Admit: 2021-06-05 | Discharge: 2021-06-05 | Disposition: A | Discharge status: Home / Self Care | Payer: Medicaid Other | Source: Ambulatory Visit | Attending: Family Medicine | Admitting: Family Medicine

## 2021-06-05 VITALS — BP 125/86 | HR 103

## 2021-06-05 DIAGNOSIS — Z113 Encounter for screening for infections with a predominantly sexual mode of transmission: Secondary | ICD-10-CM | POA: Insufficient documentation

## 2021-06-05 MED ORDER — DOXYCYCLINE HYCLATE 100 MG PO CAPS: 100.0000 mg | ORAL_CAPSULE | Freq: Two times a day (BID) | ORAL | 0 refills | Status: DC

## 2021-06-05 NOTE — Progress Notes (Signed)
SUBJECTIVE:  30 y.o. female who desires a STI screen. Denies abnormal vaginal discharge, bleeding or significant pelvic pain. No UTI symptoms. Partner informed her he has chlamydia.   No LMP recorded. Patient has had an implant.  OBJECTIVE:  She appears well.   ASSESSMENT:  STI Screen   PLAN:  Pt offered STI blood screening-declined. GC, chlamydia, and trichomonas probe sent to lab.  Treatment: Will treat for known chlamydia exposure and will wait for lab results determined once lab results are received.  Pt follow up as needed.

## 2021-06-06 LAB — CERVICOVAGINAL ANCILLARY ONLY
Chlamydia: POSITIVE — AB
Comment: NEGATIVE
Comment: NEGATIVE
Comment: NORMAL
Neisseria Gonorrhea: NEGATIVE
Trichomonas: NEGATIVE

## 2021-12-29 ENCOUNTER — Emergency Department (HOSPITAL_BASED_OUTPATIENT_CLINIC_OR_DEPARTMENT_OTHER): Payer: No Typology Code available for payment source

## 2021-12-29 ENCOUNTER — Other Ambulatory Visit: Payer: Self-pay

## 2021-12-29 ENCOUNTER — Emergency Department (HOSPITAL_BASED_OUTPATIENT_CLINIC_OR_DEPARTMENT_OTHER): Payer: No Typology Code available for payment source | Admitting: Radiology

## 2021-12-29 ENCOUNTER — Encounter (HOSPITAL_BASED_OUTPATIENT_CLINIC_OR_DEPARTMENT_OTHER): Payer: Self-pay

## 2021-12-29 ENCOUNTER — Emergency Department (HOSPITAL_BASED_OUTPATIENT_CLINIC_OR_DEPARTMENT_OTHER)
Admission: EM | Admit: 2021-12-29 | Discharge: 2021-12-30 | Disposition: A | Discharge status: Home / Self Care | Payer: No Typology Code available for payment source | Attending: Emergency Medicine | Admitting: Emergency Medicine

## 2021-12-29 DIAGNOSIS — M25572 Pain in left ankle and joints of left foot: Secondary | ICD-10-CM | POA: Diagnosis not present

## 2021-12-29 DIAGNOSIS — Y92410 Unspecified street and highway as the place of occurrence of the external cause: Secondary | ICD-10-CM | POA: Insufficient documentation

## 2021-12-29 DIAGNOSIS — R079 Chest pain, unspecified: Secondary | ICD-10-CM | POA: Insufficient documentation

## 2021-12-29 DIAGNOSIS — S32030A Wedge compression fracture of third lumbar vertebra, initial encounter for closed fracture: Secondary | ICD-10-CM | POA: Diagnosis not present

## 2021-12-29 DIAGNOSIS — D72829 Elevated white blood cell count, unspecified: Secondary | ICD-10-CM | POA: Diagnosis not present

## 2021-12-29 DIAGNOSIS — Z20822 Contact with and (suspected) exposure to covid-19: Secondary | ICD-10-CM | POA: Insufficient documentation

## 2021-12-29 DIAGNOSIS — R109 Unspecified abdominal pain: Secondary | ICD-10-CM | POA: Diagnosis not present

## 2021-12-29 DIAGNOSIS — S060X0A Concussion without loss of consciousness, initial encounter: Secondary | ICD-10-CM | POA: Insufficient documentation

## 2021-12-29 DIAGNOSIS — S0990XA Unspecified injury of head, initial encounter: Secondary | ICD-10-CM | POA: Diagnosis present

## 2021-12-29 LAB — COMPREHENSIVE METABOLIC PANEL
ALT: 13 U/L (ref 0–44)
AST: 16 U/L (ref 15–41)
Albumin: 3.9 g/dL (ref 3.5–5.0)
Alkaline Phosphatase: 48 U/L (ref 38–126)
Anion gap: 7 (ref 5–15)
BUN: 11 mg/dL (ref 6–20)
CO2: 25 mmol/L (ref 22–32)
Calcium: 8.9 mg/dL (ref 8.9–10.3)
Chloride: 106 mmol/L (ref 98–111)
Creatinine, Ser: 0.73 mg/dL (ref 0.44–1.00)
GFR, Estimated: >60 mL/min (ref >60)
Glucose, Bld: 95 mg/dL (ref 70–99)
Potassium: 3.9 mmol/L (ref 3.5–5.1)
Sodium: 138 mmol/L (ref 135–145)
Total Bilirubin: 0.5 mg/dL (ref 0.3–1.2)
Total Protein: 6.5 g/dL (ref 6.5–8.1)

## 2021-12-29 LAB — CBC
HCT: 38.3 % (ref 36.0–46.0)
Hemoglobin: 13.1 g/dL (ref 12.0–15.0)
MCH: 32.2 pg (ref 26.0–34.0)
MCHC: 34.2 g/dL (ref 30.0–36.0)
MCV: 94.1 fL (ref 80.0–100.0)
Platelets: 227 10*3/uL (ref 150–400)
RBC: 4.07 MIL/uL (ref 3.87–5.11)
RDW: 12.6 % (ref 11.5–15.5)
WBC: 13.5 10*3/uL — ABNORMAL HIGH (ref 4.0–10.5)
nRBC: 0 % (ref 0.0–0.2)

## 2021-12-29 LAB — RAPID URINE DRUG SCREEN, HOSP PERFORMED
Amphetamines: POSITIVE — AB
Barbiturates: NOT DETECTED
Benzodiazepines: NOT DETECTED
Cocaine: NOT DETECTED
Opiates: NOT DETECTED
Tetrahydrocannabinol: NOT DETECTED

## 2021-12-29 LAB — RESP PANEL BY RT-PCR (FLU A&B, COVID) ARPGX2
Influenza A by PCR: NEGATIVE
Influenza B by PCR: NEGATIVE
SARS Coronavirus 2 by RT PCR: NEGATIVE

## 2021-12-29 LAB — HCG, QUANTITATIVE, PREGNANCY: hCG, Beta Chain, Quant, S: <1 m[IU]/mL (ref <5)

## 2021-12-29 LAB — ETHANOL: Alcohol, Ethyl (B): 11 mg/dL — ABNORMAL HIGH (ref <10)

## 2021-12-29 MED ORDER — IOHEXOL 300 MG/ML  SOLN
100.0000 mL | Freq: Once | INTRAMUSCULAR | Status: AC | PRN
  Administered 2021-12-29: 100 mL via INTRAVENOUS

## 2021-12-29 MED ORDER — FENTANYL CITRATE PF 50 MCG/ML IJ SOSY
50.0000 ug | PREFILLED_SYRINGE | Freq: Once | INTRAMUSCULAR | Status: AC
  Administered 2021-12-29: 50 ug via INTRAVENOUS
  Filled 2021-12-29: qty 1
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
## 2021-12-29 NOTE — ED Notes (Signed)
Pt still unable to give a urine sample at this time ?

## 2021-12-30 MED ORDER — HYDROCODONE-ACETAMINOPHEN 5-325 MG PO TABS
1.0000 | ORAL_TABLET | Freq: Once | ORAL | Status: AC
  Administered 2021-12-30: 1 via ORAL
  Filled 2021-12-30: qty 1

## 2021-12-30 MED ORDER — OXYCODONE-ACETAMINOPHEN 5-325 MG PO TABS: 1.0000 | ORAL_TABLET | Freq: Four times a day (QID) | ORAL | 0 refills | Status: DC | PRN

## 2021-12-30 NOTE — ED Notes (Signed)
Pt verbalizes understanding of discharge instructions. Opportunity for questioning and answers were provided. Pt discharged from ED to home with father.    
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
## 2021-12-30 NOTE — Discharge Instructions (Addendum)
Your CT and XR imaging was overall reassuring. You do have evidence of a compression fracture in the lumbar spine. We will place a brace and have you follow-up outpatient with spine surgery. ? ?MPRESSION:  ?CT of the chest: No acute abnormality noted.  ?   ?CT of the abdomen and pelvis: L3 mild compression deformity with  ?associated fracture of the anterior superior corner of the vertebral  ?body.  ?   ?No visceral injury is seen.  ?   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  ? ?

## 2022-01-02 ENCOUNTER — Ambulatory Visit: Payer: Medicaid Other | Admitting: Orthopaedic Surgery

## 2022-01-06 ENCOUNTER — Ambulatory Visit (INDEPENDENT_AMBULATORY_CARE_PROVIDER_SITE_OTHER): Payer: Self-pay | Admitting: Orthopaedic Surgery

## 2022-01-06 ENCOUNTER — Encounter: Payer: Self-pay | Admitting: Orthopaedic Surgery

## 2022-01-06 ENCOUNTER — Ambulatory Visit (INDEPENDENT_AMBULATORY_CARE_PROVIDER_SITE_OTHER): Payer: Self-pay

## 2022-01-06 VITALS — BP 126/87 | HR 88 | Ht 66.0 in | Wt 176.0 lb

## 2022-01-06 DIAGNOSIS — S32038A Other fracture of third lumbar vertebra, initial encounter for closed fracture: Secondary | ICD-10-CM

## 2022-01-06 DIAGNOSIS — S32039A Unspecified fracture of third lumbar vertebra, initial encounter for closed fracture: Secondary | ICD-10-CM | POA: Insufficient documentation

## 2022-01-06 DIAGNOSIS — S93402A Sprain of unspecified ligament of left ankle, initial encounter: Secondary | ICD-10-CM | POA: Insufficient documentation

## 2022-01-06 DIAGNOSIS — M545 Low back pain, unspecified: Secondary | ICD-10-CM

## 2022-01-06 DIAGNOSIS — S93492A Sprain of other ligament of left ankle, initial encounter: Secondary | ICD-10-CM
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
## 2023-01-21 ENCOUNTER — Telehealth: Payer: Self-pay

## 2023-01-21 NOTE — Telephone Encounter (Signed)
Left message for pt to call office back regarding message left on office phone.  

## 2023-01-22 ENCOUNTER — Other Ambulatory Visit (HOSPITAL_COMMUNITY)
Admission: RE | Admit: 2023-01-22 | Discharge: 2023-01-22 | Disposition: A | Discharge status: Home / Self Care | Payer: Medicaid Other | Source: Ambulatory Visit | Attending: Family Medicine | Admitting: Family Medicine

## 2023-01-22 ENCOUNTER — Ambulatory Visit (INDEPENDENT_AMBULATORY_CARE_PROVIDER_SITE_OTHER): Payer: Medicaid Other

## 2023-01-22 VITALS — BP 142/91 | HR 124

## 2023-01-22 DIAGNOSIS — Z113 Encounter for screening for infections with a predominantly sexual mode of transmission: Secondary | ICD-10-CM | POA: Insufficient documentation

## 2023-01-22 DIAGNOSIS — A599 Trichomoniasis, unspecified: Secondary | ICD-10-CM

## 2023-01-22 NOTE — Progress Notes (Signed)
SUBJECTIVE:  32 y.o. female who desires a STI screen. Denies abnormal vaginal discharge, bleeding or significant pelvic pain. No UTI symptoms. Denies history of known exposure to STD.  No LMP recorded. Patient has had an implant.  OBJECTIVE:  She appears well.   ASSESSMENT:  STI Screen   PLAN:  Pt offered STI blood screening-not indicated GC, chlamydia, and trichomonas,BV and yeast probe sent to lab.  Treatment: To be determined once lab results are received. Pt made aware INS may not cove BV and yeast due to her not having sx's. Pt voiced understanding.    Pt follow up as needed.

## 2023-01-25 LAB — CERVICOVAGINAL ANCILLARY ONLY
Bacterial Vaginitis (gardnerella): POSITIVE — AB
Candida Glabrata: POSITIVE — AB
Candida Vaginitis: NEGATIVE
Chlamydia: NEGATIVE
Comment: NEGATIVE
Comment: NEGATIVE
Comment: NEGATIVE
Comment: NEGATIVE
Comment: NEGATIVE
Comment: NORMAL
Neisseria Gonorrhea: NEGATIVE
Trichomonas: POSITIVE — AB

## 2023-01-25 NOTE — Progress Notes (Signed)
Attestation of Attending Supervision of clinical support staff: I agree with the care provided to this patient and was available for any consultation.  I have reviewed the CMA's note and chart. I was available for consult and to see the patient if needed.   Emoree Sasaki MD MPH Attending Physician Faculty Practice- [facility]  

## 2023-01-26 MED ORDER — METRONIDAZOLE 500 MG PO TABS: 500.0000 mg | ORAL_TABLET | Freq: Two times a day (BID) | ORAL | 0 refills | Status: DC

## 2023-01-26 NOTE — Addendum Note (Signed)
Addended by: Geanie Berlin on: 01/26/2023 04:50 PM   Modules accepted: Orders

## 2023-07-21 ENCOUNTER — Emergency Department (HOSPITAL_COMMUNITY): Payer: Medicaid Other

## 2023-07-21 ENCOUNTER — Emergency Department (HOSPITAL_COMMUNITY)
Admission: EM | Admit: 2023-07-21 | Discharge: 2023-07-21 | Disposition: A | Discharge status: Home / Self Care | Payer: Medicaid Other | Attending: Emergency Medicine | Admitting: Emergency Medicine

## 2023-07-21 ENCOUNTER — Encounter (HOSPITAL_COMMUNITY): Payer: Self-pay

## 2023-07-21 DIAGNOSIS — R569 Unspecified convulsions: Secondary | ICD-10-CM | POA: Diagnosis present

## 2023-07-21 LAB — RAPID URINE DRUG SCREEN, HOSP PERFORMED
Amphetamines: POSITIVE — AB
Barbiturates: NOT DETECTED
Benzodiazepines: NOT DETECTED
Cocaine: NOT DETECTED
Opiates: NOT DETECTED
Tetrahydrocannabinol: POSITIVE — AB

## 2023-07-21 LAB — CBC WITH DIFFERENTIAL/PLATELET
Abs Immature Granulocytes: 0.03 10*3/uL (ref 0.00–0.07)
Basophils Absolute: 0 10*3/uL (ref 0.0–0.1)
Basophils Relative: 0 %
Eosinophils Absolute: 0.1 10*3/uL (ref 0.0–0.5)
Eosinophils Relative: 1 %
HCT: 37.7 % (ref 36.0–46.0)
Hemoglobin: 12.7 g/dL (ref 12.0–15.0)
Immature Granulocytes: 0 %
Lymphocytes Relative: 13 %
Lymphs Abs: 1.1 10*3/uL (ref 0.7–4.0)
MCH: 32.1 pg (ref 26.0–34.0)
MCHC: 33.7 g/dL (ref 30.0–36.0)
MCV: 95.2 fL (ref 80.0–100.0)
Monocytes Absolute: 0.4 10*3/uL (ref 0.1–1.0)
Monocytes Relative: 4 %
Neutro Abs: 7.3 10*3/uL (ref 1.7–7.7)
Neutrophils Relative %: 82 %
Platelets: 239 10*3/uL (ref 150–400)
RBC: 3.96 MIL/uL (ref 3.87–5.11)
RDW: 12.3 % (ref 11.5–15.5)
WBC: 8.9 10*3/uL (ref 4.0–10.5)
nRBC: 0 % (ref 0.0–0.2)

## 2023-07-21 LAB — BASIC METABOLIC PANEL
Anion gap: 7 (ref 5–15)
BUN: 12 mg/dL (ref 6–20)
CO2: 21 mmol/L — ABNORMAL LOW (ref 22–32)
Calcium: 8.5 mg/dL — ABNORMAL LOW (ref 8.9–10.3)
Chloride: 110 mmol/L (ref 98–111)
Creatinine, Ser: 0.8 mg/dL (ref 0.44–1.00)
GFR, Estimated: >60 mL/min (ref >60)
Glucose, Bld: 106 mg/dL — ABNORMAL HIGH (ref 70–99)
Potassium: 4 mmol/L (ref 3.5–5.1)
Sodium: 138 mmol/L (ref 135–145)

## 2023-07-21 LAB — HCG, SERUM, QUALITATIVE: Preg, Serum: NEGATIVE

## 2023-07-21 LAB — ETHANOL: Alcohol, Ethyl (B): <10 mg/dL (ref <10)

## 2023-07-21 MED ORDER — LEVETIRACETAM IN NACL 1000 MG/100ML IV SOLN
1000.0000 mg | Freq: Once | INTRAVENOUS | Status: AC
  Administered 2023-07-21: 1000 mg via INTRAVENOUS
  Filled 2023-07-21: qty 100

## 2023-07-21 MED ORDER — LEVETIRACETAM 500 MG PO TABS: 500.0000 mg | ORAL_TABLET | Freq: Two times a day (BID) | ORAL | 0 refills | Status: DC

## 2023-07-21 NOTE — ED Notes (Signed)
Pt woken up and asked to provide urine specimen.
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
## 2023-07-21 NOTE — ED Triage Notes (Signed)
Pt BIB [facility] from home with c/o witnessed seizure activity that lasted about 2 mins around 2335 tonight. Per family, pt was slumped in bed and began shaking. Pt with episode of emesis post seizure and altered for about 15 minutes afterwards. 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 GCS 15 on arrival. Pt denies drug use other than marijuana.   EMS Vitals: RR 22 HR 130 BP 150/100 CBG 198

## 2023-07-21 NOTE — ED Notes (Addendum)
Pt removed self from monitor and self removed IV. Pt states she is leaving. Attempted to stop pt to wait and speak with provider but pt's boyfriend getting upset and raising voice at this RN and ED techs. Pt states nothing is wrong with her and she did not have a seizure. Pt says she "just smoked some weed and passed out".  Pt ambulated from ED with steady gait accompanied by boyfriend. No distress noted.

## 2023-07-21 NOTE — Discharge Instructions (Signed)
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

## 2023-11-06 ENCOUNTER — Ambulatory Visit (HOSPITAL_COMMUNITY)
Admission: EM | Admit: 2023-11-06 | Discharge: 2023-11-06 | Disposition: A | Discharge status: Home / Self Care | Attending: Physician Assistant | Admitting: Physician Assistant

## 2023-11-06 ENCOUNTER — Encounter (HOSPITAL_COMMUNITY): Payer: Self-pay

## 2023-11-06 DIAGNOSIS — H6641 Suppurative otitis media, unspecified, right ear: Secondary | ICD-10-CM

## 2023-11-06 DIAGNOSIS — J301 Allergic rhinitis due to pollen: Secondary | ICD-10-CM

## 2023-11-06 DIAGNOSIS — H9203 Otalgia, bilateral: Secondary | ICD-10-CM | POA: Diagnosis not present

## 2023-11-06 MED ORDER — LORATADINE 10 MG PO TABS: 10.0000 mg | ORAL_TABLET | Freq: Every day | ORAL | 0 refills | Status: AC

## 2023-11-06 MED ORDER — AMOXICILLIN 875 MG PO TABS: 875.0000 mg | ORAL_TABLET | Freq: Two times a day (BID) | ORAL | 0 refills | Status: AC

## 2023-11-06 NOTE — ED Triage Notes (Addendum)
 Patient c/o bilateral ear pain x 2 days.  Patient states she took a Benadryl at 1300 today.

## 2023-11-06 NOTE — Discharge Instructions (Signed)
 Good to meet you.  Please stay well-hydrated.  Take the medications as prescribed.  Use nasal saline to help with symptoms.  Recheck if worse or not improving.
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
## 2023-11-29 ENCOUNTER — Other Ambulatory Visit: Payer: Self-pay | Admitting: Physician Assistant

## 2024-03-08 ENCOUNTER — Ambulatory Visit: Admitting: Obstetrics and Gynecology

## 2024-03-08 ENCOUNTER — Other Ambulatory Visit (HOSPITAL_COMMUNITY)
Admission: RE | Admit: 2024-03-08 | Discharge: 2024-03-08 | Disposition: A | Discharge status: Home / Self Care | Source: Ambulatory Visit | Attending: Obstetrics and Gynecology | Admitting: Obstetrics and Gynecology

## 2024-03-08 VITALS — BP 144/98 | HR 105 | Wt 174.0 lb

## 2024-03-08 DIAGNOSIS — Z3046 Encounter for surveillance of implantable subdermal contraceptive: Secondary | ICD-10-CM

## 2024-03-08 DIAGNOSIS — Z124 Encounter for screening for malignant neoplasm of cervix: Secondary | ICD-10-CM | POA: Diagnosis present

## 2024-03-08 DIAGNOSIS — Z3040 Encounter for surveillance of contraceptives, unspecified: Secondary | ICD-10-CM

## 2024-03-08 DIAGNOSIS — Z01419 Encounter for gynecological examination (general) (routine) without abnormal findings: Secondary | ICD-10-CM

## 2024-03-08 NOTE — Progress Notes (Signed)
   Ms. Durinda M Kelso is a G1P0010 female in the office today for removal of Nexplanon ; which was inserted 04/01/2021 at [facility]. She does not desire to have it replaced today. She has no complaints today. She is considering getting pregnant in the near future.  BP (!) 144/98   Pulse (!) 105   Wt 174 lb (78.9 kg)   BMI 28.08 kg/m    Procedure Note: Consent obtained and Time-Out conducted Implant palpated in left upper arm Betadine prep done on area of excision/removal Lidocaine  infiltrated into intradermal and subcutaneous space Small 2mm incision made with scalpel Pressure applied to distal end of implant which exposed the tip through incision Tip of implant grasped with hemostat There was some adherence of implant in subcutaneous tissue.  Twisting and manipulation freed the implant from the capsule Implant removed Pressure held on incision until bleeding stopped Steristrips applied to incision Pressure dressing applied by RN Patient tolerated procedure well.   Assessment and Plan: Encounter for surveillance of contraceptive device  Encounter for Nexplanon  removal    Total face-to-face time spent during this encounter was 10 minutes. There was 5 minutes of chart review time spent prior to this encounter. Total time spent = 15 minutes.   Ala Cart, CNM  03/08/2024 5:07 PM

## 2024-03-08 NOTE — Progress Notes (Signed)
   WELL-WOMAN PHYSICAL & PAP Patient name: Taylor Green MRN [id number]  Date of birth: June 06, 1991 Chief Complaint:   No chief complaint on file.  History of Present Illness:   Taylor Green is a 33 y.o. G2P0010 Caucasian female being seen today for a routine well-woman exam.  Current complaints: pap and Nexplanon  removal  PCP: none      does not desire labs No LMP recorded. Patient has had an implant. The current method of family planning is Nexplanon .  Last pap 04/01/2021. Results were: abnormal  ASC-US  Last mammogram: n/a.  Last colonoscopy: n/a Review of Systems:   Pertinent items are noted in HPI Denies any headaches, blurred vision, fatigue, shortness of breath, chest pain, abdominal pain, abnormal vaginal discharge/itching/odor/irritation, problems with periods, bowel movements, urination, or intercourse unless otherwise stated above. Pertinent History Reviewed:  Reviewed past medical,surgical, social and family history.  Reviewed problem list, medications and allergies. Physical Assessment:   Vitals:   03/08/24 1456  BP: (!) 144/98  Pulse: (!) 105  Weight: 174 lb (78.9 kg)  Body mass index is 28.08 kg/m.        Physical Examination:   General appearance - well appearing, and in no distress  Mental status - alert, oriented to person, place, and time  Psych:  She has a normal mood and affect  Skin - warm and dry, normal color, no suspicious lesions noted  Chest - effort normal, all lung fields clear to auscultation bilaterally  Heart - normal rate and regular rhythm  Neck:  midline trachea, no thyromegaly or nodules  Breasts - breasts appear normal, no suspicious masses, no skin or nipple changes or  axillary nodes  Abdomen - soft, nontender, nondistended, no masses or organomegaly  Pelvic - VULVA: normal appearing vulva with no masses, tenderness or lesions  VAGINA: normal appearing vagina with normal color and discharge, no lesions  CERVIX: normal appearing cervix  without discharge or lesions, no CMT  Thin prep pap is done with HR HPV cotesting  UTERUS: uterus is felt to be normal size, shape, consistency and nontender   ADNEXA: No adnexal masses or tenderness noted.  Rectal - deferred  Extremities:  No swelling or varicosities noted  No results found for this or any previous visit (from the past 24 hours).  Assessment & Plan:  1. Encounter for well woman exam with routine gynecological exam (Primary)  2. Encounter for screening for cervical cancer - Cytology - PAP  3. Encounter for surveillance of contraceptive device - Nexplanon  removal -- see separate procedural note  4. Encounter for Nexplanon  removal - Nexplanon  removal -- see separate procedural note   Labs/procedures today: pap and Nexplanon  removal  Mammogram at age 89 or sooner if problems Colonoscopy at age 41 or sooner if problems  No orders of the defined types were placed in this encounter.   Meds: No orders of the defined types were placed in this encounter.   Follow-up: No follow-ups on file.  Koki Buxton MSN, CNM 03/08/2024 3:02 PM

## 2024-03-08 NOTE — Progress Notes (Signed)
 Patient presents for Annual.  LMP: 06/27-6/30 Last pap: 04/01/2021 Contraception: Nexplanon  removal today *considering having a baby. Mammogram: Not yet indicated STD Screening: Declines   CC: Heartburn asked about Rx.nothing over the counter is helping.

## 2024-03-16 ENCOUNTER — Ambulatory Visit: Payer: Self-pay | Admitting: Obstetrics and Gynecology

## 2024-03-16 ENCOUNTER — Encounter: Payer: Self-pay | Admitting: Obstetrics and Gynecology

## 2024-03-16 LAB — CYTOLOGY - PAP
Comment: NEGATIVE
Diagnosis: NEGATIVE
Diagnosis: REACTIVE
High risk HPV: NEGATIVE

## 2024-03-27 ENCOUNTER — Ambulatory Visit: Admitting: Obstetrics and Gynecology

## 2024-03-27 ENCOUNTER — Encounter: Payer: Self-pay | Admitting: Obstetrics and Gynecology

## 2024-03-27 VITALS — BP 132/92 | HR 110

## 2024-03-27 DIAGNOSIS — Z3009 Encounter for other general counseling and advice on contraception: Secondary | ICD-10-CM

## 2024-03-27 DIAGNOSIS — R030 Elevated blood-pressure reading, without diagnosis of hypertension: Secondary | ICD-10-CM | POA: Insufficient documentation

## 2024-03-27 MED ORDER — THERA VITAL M PO TABS: 1.0000 | ORAL_TABLET | Freq: Every day | ORAL | 1 refills | Status: AC

## 2024-03-27 MED ORDER — NORETHINDRONE 0.35 MG PO TABS: 1.0000 | ORAL_TABLET | Freq: Every day | ORAL | 5 refills | Status: DC

## 2024-03-27 MED ORDER — NORETHINDRONE 0.35 MG PO TABS: 1.0000 | ORAL_TABLET | Freq: Every day | ORAL | 5 refills | Status: AC
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
